# Patient Record
Sex: Female | Born: 1940 | Race: White | Hispanic: No | Marital: Married | Smoking: Former smoker
Health system: Southern US, Community
[De-identification: ages and names within clinical notes are randomized; demographics above are authoritative.]

## PROBLEM LIST (undated history)

## (undated) DIAGNOSIS — I10 Essential (primary) hypertension: Secondary | ICD-10-CM

## (undated) DIAGNOSIS — J449 Chronic obstructive pulmonary disease, unspecified: Secondary | ICD-10-CM

## (undated) DIAGNOSIS — M1990 Unspecified osteoarthritis, unspecified site: Secondary | ICD-10-CM

## (undated) DIAGNOSIS — D649 Anemia, unspecified: Secondary | ICD-10-CM

## (undated) HISTORY — PX: CHOLECYSTECTOMY: SHX55

## (undated) HISTORY — PX: EYE SURGERY: SHX253

---

## 2004-06-28 ENCOUNTER — Emergency Department: Payer: Self-pay | Admitting: Emergency Medicine

## 2006-03-11 ENCOUNTER — Emergency Department (HOSPITAL_COMMUNITY): Admission: EM | Admit: 2006-03-11 | Discharge: 2006-03-11 | Payer: Self-pay | Admitting: Emergency Medicine

## 2008-12-06 ENCOUNTER — Ambulatory Visit (HOSPITAL_COMMUNITY): Admission: RE | Admit: 2008-12-06 | Discharge: 2008-12-06 | Payer: Self-pay | Admitting: Ophthalmology

## 2008-12-20 ENCOUNTER — Ambulatory Visit (HOSPITAL_COMMUNITY): Admission: RE | Admit: 2008-12-20 | Discharge: 2008-12-20 | Payer: Self-pay | Admitting: Ophthalmology

## 2010-10-21 LAB — BASIC METABOLIC PANEL
Calcium: 10.6 mg/dL — ABNORMAL HIGH (ref 8.4–10.5)
GFR calc Af Amer: >60 mL/min (ref >60)
GFR calc non Af Amer: >60 mL/min (ref >60)
Potassium: 4.7 mEq/L (ref 3.5–5.1)
Sodium: 136 mEq/L (ref 135–145)

## 2010-10-21 LAB — HEMOGLOBIN AND HEMATOCRIT, BLOOD: HCT: 37.2 % (ref 36.0–46.0)

## 2018-11-25 ENCOUNTER — Institutional Professional Consult (permissible substitution): Payer: Self-pay | Admitting: Pulmonary Disease

## 2018-12-13 ENCOUNTER — Ambulatory Visit (INDEPENDENT_AMBULATORY_CARE_PROVIDER_SITE_OTHER): Payer: Self-pay

## 2018-12-13 ENCOUNTER — Ambulatory Visit: Payer: Medicare Other | Admitting: Internal Medicine

## 2018-12-13 ENCOUNTER — Other Ambulatory Visit: Payer: Self-pay

## 2018-12-13 ENCOUNTER — Encounter: Payer: Self-pay | Admitting: Internal Medicine

## 2018-12-13 VITALS — BP 124/62 | HR 68 | Temp 98.4°F | Ht 64.0 in | Wt 162.4 lb

## 2018-12-13 DIAGNOSIS — J449 Chronic obstructive pulmonary disease, unspecified: Secondary | ICD-10-CM | POA: Insufficient documentation

## 2018-12-13 DIAGNOSIS — I10 Essential (primary) hypertension: Secondary | ICD-10-CM

## 2018-12-13 MED ORDER — TELMISARTAN 80 MG PO TABS: 80.0000 mg | ORAL_TABLET | Freq: Every day | ORAL | 2 refills | Status: DC

## 2018-12-13 MED ORDER — BUDESONIDE-FORMOTEROL FUMARATE 80-4.5 MCG/ACT IN AERO: 2.0000 | INHALATION_SPRAY | Freq: Two times a day (BID) | RESPIRATORY_TRACT | 0 refills | Status: DC

## 2018-12-13 MED ORDER — BUDESONIDE-FORMOTEROL FUMARATE 80-4.5 MCG/ACT IN AERO: 2.0000 | INHALATION_SPRAY | Freq: Two times a day (BID) | RESPIRATORY_TRACT | 11 refills | Status: DC

## 2018-12-13 NOTE — Patient Instructions (Addendum)
Stop advair and zestril   Start Symbicort 80 Take 2 puffs first thing in am and then another 2 puffs about 12 hours later.   Only use your albuterol as a rescue medication to be used if you can't catch your breath by resting or doing a relaxed purse lip breathing pattern.  - The less you use it, the better it will work when you need it. - Ok to use up to 2 puffs  every 4 hours if you must but call for immediate appointment if use goes up over your usual need - Don't leave home without it !!  (think of it like the spare tire for your car)    Start micardis 80 mg one daily for now   Call if any problems with the medications changed   Please remember to go to the  x-ray department  for your tests - we will call you with the results when they are available   I will clear you for surgery pending review of your cxr

## 2018-12-13 NOTE — Progress Notes (Signed)
Jamie Rollins, female    DOB: [birth year],    MRN: [id number]   Brief patient profile:  77 yowf quit smoking 2015 s obvious residuals though noted slowed down a bit but walked same pace others her age until R hip problem x2019 but baseline = MMRC1 = can walk nl pace, flat grade, can't hurry or go uphills or steps s sob    Referred to pulmonary clinic 12/13/2018 by Dr Joanne Chars not scheduled yet     History of Present Illness  12/13/2018  Pulmonary/ 1st office eval/Jewett Mcgann  Chief Complaint  Patient presents with   COPD    Surgical Clearance needed. Has already quit smoking about 5 years ago.  Dyspnea:  [employer] walking better if done p hip injection, last one was done end of March 2020  Cough: none Sleep: sleep on side/ no noct symptoms  SABA use: on advair twice daily and up to bid saba but can't tell either does much    No obvious day to day or daytime variability or assoc excess/ purulent sputum or mucus plugs or hemoptysis or cp or chest tightness, subjective wheeze or overt sinus or hb symptoms.   Sleeping as above  without nocturnal  or early am exacerbation  of respiratory  c/o's or need for noct saba. Also denies any obvious fluctuation of symptoms with weather or environmental changes or other aggravating or alleviating factors except as outlined above   No unusual exposure hx or h/o childhood pna/ asthma or knowledge of premature birth.  Current Allergies, Complete Past Medical History, Past Surgical History, Family History, and Social History were reviewed in [employer] record.  ROS  The following are not active complaints unless bolded Hoarseness, sore throat, dysphagia, dental problems, itching, sneezing,  nasal congestion or discharge of excess mucus or purulent secretions, ear ache,   fever, chills, sweats, unintended wt loss or wt gain, classically pleuritic or exertional cp,  orthopnea pnd or arm/hand swelling  or leg swelling, presyncope, palpitations,  abdominal pain, anorexia, nausea, vomiting, diarrhea  or change in bowel habits or change in bladder habits, change in stools or change in urine, dysuria, hematuria,  rash, arthralgias, visual complaints, headache, numbness, weakness or ataxia or problems with walking or coordination,  change in mood or  memory.           No past medical history on file.  Outpatient Medications Prior to Visit  Medication Sig Dispense Refill   ADVAIR DISKUS 250-50 MCG/DOSE AEPB Take 1 puff by mouth 2 (two) times a day.     atorvastatin (LIPITOR) 20 MG tablet Take 1 tablet by mouth daily.     gabapentin (NEURONTIN) 100 MG capsule Take 1 capsule by mouth 2 (two) times a day.     HYDROcodone-acetaminophen (NORCO/VICODIN) 5-325 MG tablet Take 1 tablet by mouth as needed.     lisinopril (ZESTRIL) 20 MG tablet Take 1 tablet by mouth daily.     metoprolol succinate (TOPROL-XL) 25 MG 24 hr tablet Take 1 tablet by mouth daily.     omeprazole (PRILOSEC) 40 MG capsule Take 1 tablet by mouth daily.       Objective:     BP 124/62 (BP Location: Left Arm, Patient Position: Sitting, Cuff Size: Normal)    Pulse 68    Temp 98.4 F (36.9 C)    Ht  (1.626 m)    Wt 162 lb 6.4 oz (73.7 kg)    SpO2 98%    BMI  27.88 kg/m   SpO2: 98 %       HEENT: Full top denture/ nl oropharynx. Nl external ear canals without cough reflex -  Mild bilateral non-specific turbinate edema     NECK :  without JVD/Nodes/TM/ nl carotid upstrokes bilaterally   LUNGS: no acc muscle use,  Min/mild barrel  contour chest wall with bilateral  Distant bs s audible wheeze and  without cough on insp or exp maneuver and mild  Hyperresonant  to  percussion bilaterally     CV:  RRR  no s3 or murmur or increase in P2, and no edema   ABD:  soft and nontender with pos end  insp Hoover's  in the supine position. No bruits or organomegaly appreciated, bowel sounds nl  MS:   Nl gait/  ext warm without deformities, calf tenderness, cyanosis or  clubbing No obvious joint restrictions   SKIN: warm and dry without lesions    NEURO:  alert, approp, nl sensorium with  no motor or cerebellar deficits apparent.    CXR PA and Lateral:   12/13/2018 :    I personally reviewed images and agree with radiology impression as follows:    Stable exam.  No active cardiopulmonary disease        Assessment   COPD GOLD ?  Quit smoking 2015  "[facility] but nothing in their records - 12/13/2018  After extensive coaching inhaler device,  effectiveness =    75%> change advair to symb 80 2bid and ok to taper post op off  It really doesn't appear she has much copd clinically and my main concern is that chronic use of advair sometimes mimics symptoms of copd/ asthma that are very hard to sort out especially in a pt on ACEi  >>>> Therefore rec Try off advair and on symb 80 2bid and ok to taper off post op as long as not flaring or limited by doe  Stop acei for duration of surgery (see sep a/p) Cleared for surgery unless having new resp symptoms between now and surgery     Essential hypertension D/c acei 12/13/2018 (preop concern)  ACE inhibitors are problematic in  pts with airway complaints because  even experienced pulmonologists can't always distinguish ace effects from copd/asthma.  By themselves they don't actually cause a problem, much like oxygen can't by itself start a fire, but they certainly serve as a powerful catalyst or enhancer for any "fire"  or inflammatory process in the upper airway, be it caused by an ET  tube or more commonly reflux (especially in the obese or pts with known GERD or who are on biphoshonates).    In the era of ARB near equivalency until we have a better handle on the reversibility of the airway problem, it just makes sense to avoid ACEI  entirely in the short run and then decide later, having established a level of airway control using a reasonable limited regimen, whether to add back ace but even then  being very careful to observe the pt for worsening airway control and number of meds used/ needed to control symptoms.    >>>>For now changed to micardis 80 mg daily and instructed to reduce by one half if too strong.   >>>> F/u per PCP post op, but certainly call or return here for any problems related to change meds or resp status   Total time devoted to counseling  > 50 % of initial 60 min office visit:  reviewed case with pt/  device teaching which extended face to face time for this visit  discussion of options/alternatives/ personally creating written customized instructions  in presence of pt  then going over those specific  Instructions directly with the pt including how to use all of the meds but in particular covering each new medication in detail and the difference between the maintenance= "automatic" meds and the prns using an action plan format for the latter (If this problem/symptom => do that organization reading Left to right).  Please see AVS from this visit for a full list of these instructions which I personally wrote for this pt and  are unique to this visit.    Outpatient Encounter Medications as of 12/13/2018 @ discharge from clinic  Medication Sig   atorvastatin (LIPITOR) 20 MG tablet Take 1 tablet by mouth daily.   budesonide-formoterol (SYMBICORT) 80-4.5 MCG/ACT inhaler Inhale 2 puffs into the lungs 2 (two) times a day.   gabapentin (NEURONTIN) 100 MG capsule Take 1 capsule by mouth 2 (two) times a day.   HYDROcodone-acetaminophen (NORCO/VICODIN) 5-325 MG tablet Take 1 tablet by mouth as needed.   metoprolol succinate (TOPROL-XL) 25 MG 24 hr tablet Take 1 tablet by mouth daily.   omeprazole (PRILOSEC) 40 MG capsule Take 1 tablet by mouth daily.   telmisartan (MICARDIS) 80 MG tablet Take 1 tablet (80 mg total) by mouth daily.         Sandrea Hughs, MD 12/13/2018

## 2018-12-13 NOTE — Assessment & Plan Note (Signed)
Quit smoking 2015  "when dx'd at [facility]" but nothing in their records - 12/13/2018  After extensive coaching inhaler device,  effectiveness =    75%> change advair to symb 80 2bid and ok to taper post op off  It really doesn't appear she has much copd clinically and my main concern is that chronic use of advair sometimes mimics symptoms of copd/ asthma that are very hard to sort out especially in a pt on ACEi  Therefore rec Try off advair and on symb 80 2bid and ok to taper off post op as long as not flaring or limited by doe  Stop acei for duration of surgery (see sep a/p) Cleared for surgery unless having new resp symptoms between now and surgery

## 2018-12-13 NOTE — Assessment & Plan Note (Signed)
D/c acei 12/13/2018 (preop concern)  ACE inhibitors are problematic in  pts with airway complaints because  even experienced pulmonologists can't always distinguish ace effects from copd/asthma.  By themselves they don't actually cause a problem, much like oxygen can't by itself start a fire, but they certainly serve as a powerful catalyst or enhancer for any "fire"  or inflammatory process in the upper airway, be it caused by an ET  tube or more commonly reflux (especially in the obese or pts with known GERD or who are on biphoshonates).    In the era of ARB near equivalency until we have a better handle on the reversibility of the airway problem, it just makes sense to avoid ACEI  entirely in the short run and then decide later, having established a level of airway control using a reasonable limited regimen, whether to add back ace but even then being very careful to observe the pt for worsening airway control and number of meds used/ needed to control symptoms.    For now changed to micardis 80 mg daily and instructed to reduce by one half if too strong.   F/u per PCP post op, but certainly call or return here for any problems related to change meds or resp status   Total time devoted to counseling  > 50 % of initial 60 min office visit:  reviewed case with pt/  device teaching which extended face to face time for this visit  discussion of options/alternatives/ personally creating written customized instructions  in presence of pt  then going over those specific  Instructions directly with the pt including how to use all of the meds but in particular covering each new medication in detail and the difference between the maintenance= "automatic" meds and the prns using an action plan format for the latter (If this problem/symptom => do that organization reading Left to right).  Please see AVS from this visit for a full list of these instructions which I personally wrote for this pt and  are unique to this  visit.

## 2018-12-14 ENCOUNTER — Encounter: Payer: Self-pay | Admitting: Internal Medicine

## 2018-12-14 NOTE — Progress Notes (Signed)
Spoke with pt and notified of results per Dr. Wert. Pt verbalized understanding and denied any questions. 

## 2019-01-16 ENCOUNTER — Telehealth: Payer: Self-pay | Admitting: Internal Medicine

## 2019-01-16 NOTE — Telephone Encounter (Signed)
Called and spoke to patient. Relayed results that Magda Paganini gave patient on 12/14/2018.  Patient had forgotten that she was called the day after her visit with Dr. Melvyn Novas.  Patient verbalized understanding.  Patient asked what her next step was for surgery. I let her know if the surgeon's office needs anything from [place] they can fax over a surgical clearance form. Patient stated that she doesn't have a surgeon yet and didn't know how to get one.  Asked patient how she knew she needed surgical clearance if she hadn't met with a surgeon. Patient stated that her PCP sent her here.  Told patient to follow back up with PCP about who to see next. Patient stated she will call her PCP tomorrow.  Nothing further needed.

## 2019-01-19 ENCOUNTER — Encounter: Payer: Self-pay | Admitting: Orthopaedic Surgery

## 2019-01-19 ENCOUNTER — Other Ambulatory Visit: Payer: Self-pay

## 2019-01-19 ENCOUNTER — Ambulatory Visit (INDEPENDENT_AMBULATORY_CARE_PROVIDER_SITE_OTHER): Payer: Medicare Other

## 2019-01-19 ENCOUNTER — Ambulatory Visit (INDEPENDENT_AMBULATORY_CARE_PROVIDER_SITE_OTHER): Payer: Medicare Other | Admitting: Orthopaedic Surgery

## 2019-01-19 VITALS — BP 159/75 | HR 73 | Ht 64.0 in | Wt 161.0 lb

## 2019-01-19 DIAGNOSIS — G8929 Other chronic pain: Secondary | ICD-10-CM

## 2019-01-19 DIAGNOSIS — M160 Bilateral primary osteoarthritis of hip: Secondary | ICD-10-CM | POA: Diagnosis not present

## 2019-01-19 DIAGNOSIS — M25551 Pain in right hip: Secondary | ICD-10-CM

## 2019-01-22 ENCOUNTER — Encounter: Payer: Self-pay | Admitting: Orthopaedic Surgery

## 2019-01-22 DIAGNOSIS — M160 Bilateral primary osteoarthritis of hip: Secondary | ICD-10-CM | POA: Insufficient documentation

## 2019-01-22 NOTE — Progress Notes (Signed)
Office Visit Note   Patient: Jamie KitchensSharon M Rollins           Date of Birth: 02/27/1941           MRN: [id number] Visit Date: 01/19/2019              Requested by: Kirstie PeriShah, Ashish, MD [address] [place],  [place] [id number] PCP: Kirstie PeriShah, Ashish, MD   Assessment & Plan: Visit Diagnoses:  1. Chronic right hip pain   2. Bilateral primary osteoarthritis of hip     Plan: Right hip severe osteoarthritis.  She has pain that wakes her up at night she is used a cane taken anti-inflammatories topical creams.  Bothers her on a daily basis and with community ambulation.  We discussed total hip arthroplasty discussed operative technique, direct anterior approach likely 1-2 night stay in the hospital.  Risks of surgery discussed her questions were elicited and answered.  She understands and can call if she like to proceed.  Scheduling information given.  Follow-Up Instructions: No follow-ups on file.   Orders:  Orders Placed This Encounter  Procedures  . XR HIP UNILAT W OR W/O PELVIS 2-3 VIEWS RIGHT   No orders of the defined types were placed in this encounter.     Procedures: No procedures performed   Clinical Data: No additional findings.   Subjective: Chief Complaint  Patient presents with  . Right Hip - Pain    HPI 78 year old female with right hip pain that radiates from her hip down to her knee.  She is not been taking any pain medication for this.  She is past history of injections by Dr. Maurice SmallIbazebo last injetion 09/2018.  Patient states injection gave her some relief.  Hip x-rays on 03/17/2018 showed prominent degenerative changes lumbar spine and both hips.  Subchondral cyst noted in the right acetabulum.  Changes consistent with degenerative hip disease.  She is noticed Trendelenburg gait.  She has had sharp pain when she tries to cross her leg.  Pain in her hip wakes her up at night.  Patient quit smoking 5 years ago.  Pulmonary doctor is Dr. Casimiro NeedleMichael work.  She has been told she needed surgery on  her hip with hip replacement.  She has had progressive increased groin pain that radiates down to her knee progressive limping at times when she is having great difficulty ambulating despite using a cane.  Previous trochanteric injections as well as intra-articular hip injections on the right hip.  She had temporary relief.  Pulmonary note from Dr. Casimiro NeedleMichael work demonstrates  mild COPD.  Dr. Shona SimpsonWertz note from 12/13/2018 states patient was cleared for surgery from pulmonary standpoint.  Review of Systems 14 point review of systems positive for bilateral hip osteoarthritis worse on the right than left.  Positive for hypertension acid reflux cataracts teeth and gum disease.  Otherwise negative is obtains HPI.   Objective: Vital Signs: BP (!) 159/75   Pulse 73   Ht 5\' 4"  (1.626 m)   Wt 161 lb (73 kg)   BMI 27.64 kg/m   Physical Exam Constitutional:      Appearance: She is well-developed.  HENT:     Head: Normocephalic.     Right Ear: External ear normal.     Left Ear: External ear normal.  Eyes:     Pupils: Pupils are equal, round, and reactive to light.  Neck:     Thyroid: No thyromegaly.     Trachea: No tracheal deviation.  Cardiovascular:  Rate and Rhythm: Normal rate.  Pulmonary:     Effort: Pulmonary effort is normal. No respiratory distress.     Breath sounds: Stridor present. No wheezing or rhonchi.  Abdominal:     Palpations: Abdomen is soft.  Skin:    General: Skin is warm and dry.  Neurological:     Mental Status: She is alert and oriented to person, place, and time.  Psychiatric:        Behavior: Behavior normal.     Ortho Exam patient has mild discomfort with internal rotation left hip.  Right hip only has 15 degrees internal rotation with severe pain reproduction of her pain.  Positive Trendelenburg gait on the right.  Negative on the left.  Knee and ankle jerk are 2+ and symmetrical distal pulses are intact.  Negative straight leg raising 90 degrees knee shows minimal  crepitus.  Specialty Comments:  No specialty comments available.  Imaging: No results found.   PMFS History: Patient Active Problem List   Diagnosis Date Noted  . Bilateral primary osteoarthritis of hip 01/22/2019  . COPD GOLD ?  12/13/2018  . Essential hypertension 12/13/2018   No past medical history on file.  No family history on file.  No past surgical history on file. Social History   Occupational History  . Not on file  Tobacco Use  . Smoking status: Former Smoker    Types: Cigarettes    Quit date: 12/12/2013    Years since quitting: 5.1  . Smokeless tobacco: Never Used  Substance and Sexual Activity  . Alcohol use: Not on file  . Drug use: Not on file  . Sexual activity: Not on file

## 2019-02-02 ENCOUNTER — Ambulatory Visit (INDEPENDENT_AMBULATORY_CARE_PROVIDER_SITE_OTHER): Payer: Medicare Other | Admitting: Surgery

## 2019-02-02 ENCOUNTER — Other Ambulatory Visit: Payer: Self-pay

## 2019-02-02 ENCOUNTER — Encounter: Payer: Self-pay | Admitting: Surgery

## 2019-02-02 VITALS — BP 128/60 | HR 68 | Ht 64.0 in | Wt 161.0 lb

## 2019-02-02 DIAGNOSIS — M1611 Unilateral primary osteoarthritis, right hip: Secondary | ICD-10-CM

## 2019-02-02 NOTE — Progress Notes (Signed)
78 year old white female history of end-stage DJD right hip and pain comes in for preop evaluation.  Patient scheduled for right total hip replacement and she is wanting to proceed as scheduled.  Symptoms unchanged from last office visit.  Surgical procedure along with potential rehab/recovery time discussed.  All questions answered and she wishes to proceed.

## 2019-02-07 NOTE — Progress Notes (Signed)
[facility] - [place], [place] - [address] [place] [id number] Phone: [phone] Fax: [phone]    Your procedure is scheduled on Wednesday, August 5th.  [facility] to [facility] Main Entr[facility] "A" at 10:30 A.M., and check in at the Admitting office.  Call this number if you have problems the morning of surgery:  [phone]  Call [phone] if you have any questions prior to your surgery date Monday-Friday 8am-4pm   Remember:  Do not eat after midnight the night before your surgery  You may drink clear liquids until 9:30 the morning of your surgery.   Clear liquids allowed are: Water, Non-Citrus Juices (without pulp), Carbonated Beverages, Clear Tea, Black Coffee Only, and Gatorade    Take these medicines the morning of surgery with A SIP OF WATER  atorvastatin (LIPITOR) budesonide-formoterol (SYMBICORT) - inhaler gabapentin (NEURONTIN)  metoprolol succinate (TOPROL-XL) omeprazole (PRILOSEC)   If needed - albuterol (VENTOLIN HFA) -inhaler, HYDROcodone-acetaminophen ,   Follow your surgeon's instructions on when to stop Aspirin.  If no instructions were given by your surgeon then you will need to call the office to get those instructions.    7 days prior to surgery STOP taking any Aspirin (unless otherwise instructed by your surgeon), Aleve, Naproxen, Ibuprofen, Motrin, Advil, Goody's, BC's, all herbal medications, fish oil, and all vitamins.   The Morning of Surgery  Do not wear jewelry, make-up or nail polish.  Do not wear lotions, powders, or perfumes/colognes, or deodorant  Do not shave 48 hours prior to surgery.    Do not bring valuables to the hospital.  [facility] is not responsible for any belongings or valuables.  If you are a smoker, DO NOT Smoke 24 hours prior to surgery IF you wear a CPAP at night please bring your mask, tubing, and machine the morning of surgery   Remember that you must have someone to transport you home after your  surgery, and remain with you for 24 hours if you are discharged the same day.  Contacts, glasses, hearing aids, dentures or bridgework may not be worn into surgery.   Leave your suitcase in the car.  After surgery it may be brought to your room.  For patients admitted to the hospital, discharge time will be determined by your treatment team.  Patients discharged the day of surgery will not be allowed to drive home.   Special instructions:   [place]- Preparing For Surgery  Before surgery, you can play an important role. Because skin is not sterile, your skin needs to be as free of germs as possible. You can reduce the number of germs on your skin by washing with CHG (chlorahexidine gluconate) Soap before surgery.  CHG is an antiseptic cleaner which kills germs and bonds with the skin to continue killing germs even after washing.    Oral Hygiene is also important to reduce your risk of infection.  Remember - BRUSH YOUR TEETH THE MORNING OF SURGERY WITH YOUR REGULAR TOOTHPASTE  Please do not use if you have an allergy to CHG or antibacterial soaps. If your skin becomes reddened/irritated stop using the CHG.  Do not shave (including legs and underarms) for at least 48 hours prior to first CHG shower. It is OK to shave your face.  Please follow these instructions carefully.   1. Shower the NIGHT BEFORE SURGERY and the MORNING OF SURGERY with CHG Soap.   2. If you chose to wash your hair, wash your hair  first as usual with your normal shampoo.  3. After you shampoo, rinse your hair and body thoroughly to remove the shampoo.  4. Use CHG as you would any other liquid soap. You can apply CHG directly to the skin and wash gently with a scrungie or a clean washcloth.   5. Apply the CHG Soap to your body ONLY FROM THE NECK DOWN.  Do not use on open wounds or open sores. Avoid contact with your eyes, ears, mouth and genitals (private parts). Wash Face and genitals (private parts)  with your  normal soap.   6. Wash thoroughly, paying special attention to the area where your surgery will be performed.  7. Thoroughly rinse your body with warm water from the neck down.  8. DO NOT shower/wash with your normal soap after using and rinsing off the CHG Soap.  9. Pat yourself dry with a CLEAN TOWEL.  10. Wear CLEAN PAJAMAS to bed the night before surgery, wear comfortable clothes the morning of surgery  11. Place CLEAN SHEETS on your bed the night of your first shower and DO NOT SLEEP WITH PETS.  Day of Surgery: Do not apply any deodorants/lotions. Please shower the morning of surgery with the CHG soap  Please wear clean clothes to the hospital/surgery center.   Remember to brush your teeth WITH YOUR REGULAR TOOTHPASTE.  Please read over the following fact sheets that you were given.

## 2019-02-08 ENCOUNTER — Encounter (HOSPITAL_COMMUNITY): Payer: Self-pay

## 2019-02-08 ENCOUNTER — Encounter (HOSPITAL_COMMUNITY)
Admission: RE | Admit: 2019-02-08 | Discharge: 2019-02-08 | Disposition: A | Discharge status: Home / Self Care | Payer: Medicare Other | Source: Ambulatory Visit | Attending: Orthopaedic Surgery | Admitting: Orthopaedic Surgery

## 2019-02-08 ENCOUNTER — Other Ambulatory Visit: Payer: Self-pay

## 2019-02-08 DIAGNOSIS — Z20828 Contact with and (suspected) exposure to other viral communicable diseases: Secondary | ICD-10-CM | POA: Diagnosis not present

## 2019-02-08 DIAGNOSIS — Z01818 Encounter for other preprocedural examination: Secondary | ICD-10-CM | POA: Insufficient documentation

## 2019-02-08 HISTORY — DX: Chronic obstructive pulmonary disease, unspecified: J44.9

## 2019-02-08 HISTORY — DX: Essential (primary) hypertension: I10

## 2019-02-08 HISTORY — DX: Anemia, unspecified: D64.9

## 2019-02-08 HISTORY — DX: Unspecified osteoarthritis, unspecified site: M19.90

## 2019-02-08 LAB — URINALYSIS, ROUTINE W REFLEX MICROSCOPIC
Bilirubin Urine: NEGATIVE
Glucose, UA: NEGATIVE mg/dL
Hgb urine dipstick: NEGATIVE
Ketones, ur: NEGATIVE mg/dL
Leukocytes,Ua: NEGATIVE
Nitrite: NEGATIVE
Protein, ur: NEGATIVE mg/dL
Specific Gravity, Urine: 1.008 (ref 1.005–1.030)
pH: 6 (ref 5.0–8.0)

## 2019-02-08 LAB — COMPREHENSIVE METABOLIC PANEL
ALT: 13 U/L (ref 0–44)
AST: 25 U/L (ref 15–41)
Albumin: 3.7 g/dL (ref 3.5–5.0)
Alkaline Phosphatase: 93 U/L (ref 38–126)
Anion gap: 8 (ref 5–15)
BUN: 20 mg/dL (ref 8–23)
CO2: 23 mmol/L (ref 22–32)
Calcium: 10.9 mg/dL — ABNORMAL HIGH (ref 8.9–10.3)
Chloride: 107 mmol/L (ref 98–111)
Creatinine, Ser: 1.29 mg/dL — ABNORMAL HIGH (ref 0.44–1.00)
GFR calc Af Amer: 47 mL/min — ABNORMAL LOW (ref >60)
GFR calc non Af Amer: 40 mL/min — ABNORMAL LOW (ref >60)
Glucose, Bld: 101 mg/dL — ABNORMAL HIGH (ref 70–99)
Potassium: 5.2 mmol/L — ABNORMAL HIGH (ref 3.5–5.1)
Sodium: 138 mmol/L (ref 135–145)
Total Bilirubin: 0.8 mg/dL (ref 0.3–1.2)
Total Protein: 6.5 g/dL (ref 6.5–8.1)

## 2019-02-08 LAB — CBC
HCT: 32.8 % — ABNORMAL LOW (ref 36.0–46.0)
Hemoglobin: 10.8 g/dL — ABNORMAL LOW (ref 12.0–15.0)
MCH: 33.6 pg (ref 26.0–34.0)
MCHC: 32.9 g/dL (ref 30.0–36.0)
MCV: 102.2 fL — ABNORMAL HIGH (ref 80.0–100.0)
Platelets: 483 10*3/uL — ABNORMAL HIGH (ref 150–400)
RBC: 3.21 MIL/uL — ABNORMAL LOW (ref 3.87–5.11)
RDW: 13.5 % (ref 11.5–15.5)
WBC: 6.4 10*3/uL (ref 4.0–10.5)
nRBC: 0 % (ref 0.0–0.2)

## 2019-02-08 LAB — SURGICAL PCR SCREEN
MRSA, PCR: NEGATIVE
Staphylococcus aureus: NEGATIVE

## 2019-02-08 NOTE — Progress Notes (Addendum)
PCP - Dr Monico Blitz Cardiologist - denies  Chest x-ray - 12/13/2018 EKG - 02/08/2019 Stress Test - denies ECHO - denies Cardiac Cath - denies  Sleep Study - denies CPAP - N/A  Blood Thinner Instructions: N/A Aspirin Instructions: per patient "no instructions received" patient instructed to contact surgeon's office for further instructions  Anesthesia review: YES, abnormal labs  Coronavirus Screening  Have you experienced the following symptoms:  Cough yes/no: No Fever (>100.51F)  yes/no: No Runny nose yes/no: No Sore throat yes/no: No Difficulty breathing/shortness of breath  yes/no: No  Have you or a family member traveled in the last 14 days and where? yes/no: No  If the patient indicates "YES" to the above questions, their PAT will be rescheduled to limit the exposure to others and, the surgeon will be notified. THE PATIENT WILL NEED TO BE ASYMPTOMATIC FOR 14 DAYS.   If the patient is not experiencing any of these symptoms, the PAT nurse will instruct them to NOT bring anyone with them to their appointment since they may have these symptoms or traveled as well.   Please remind your patients and families that hospital visitation restrictions are in effect and the importance of the restrictions.   Patient denies shortness of breath, fever, cough and chest pain at PAT appointment  Patient verbalized understanding of instructions that were given to them at the PAT appointment. Patient was also instructed that they will need to review over the PAT instructions again at home before surgery.

## 2019-02-09 NOTE — Anesthesia Preprocedure Evaluation (Addendum)
Anesthesia Evaluation  Patient identified by MRN, date of birth, ID band Patient awake    Reviewed: Allergy & Precautions, NPO status , Patient's Chart, lab work & pertinent test results  History of Anesthesia Complications Negative for: history of anesthetic complications  Airway Mallampati: II  TM Distance: >3 FB Neck ROM: Full    Dental  (+) Dental Advisory Given, Upper Dentures   Pulmonary COPD,  COPD inhaler, former smoker,    Pulmonary exam normal        Cardiovascular hypertension, Pt. on home beta blockers and Pt. on medications (-) anginaNormal cardiovascular exam     Neuro/Psych negative neurological ROS  negative psych ROS   GI/Hepatic Neg liver ROS, GERD  Medicated and Controlled,  Endo/Other  negative endocrine ROS  Renal/GU Renal InsufficiencyRenal disease     Musculoskeletal  (+) Arthritis ,   Abdominal   Peds  Hematology  (+) anemia ,   Anesthesia Other Findings   Reproductive/Obstetrics                           Anesthesia Physical Anesthesia Plan  ASA: III  Anesthesia Plan: Spinal   Post-op Pain Management:    Induction:   PONV Risk Score and Plan: 2 and Treatment may vary due to age or medical condition and Propofol infusion  Airway Management Planned: Natural Airway and Simple Face Mask  Additional Equipment: None  Intra-op Plan:   Post-operative Plan:   Informed Consent: I have reviewed the patients History and Physical, chart, labs and discussed the procedure including the risks, benefits and alternatives for the proposed anesthesia with the patient or authorized representative who has indicated his/her understanding and acceptance.       Plan Discussed with: CRNA and Anesthesiologist  Anesthesia Plan Comments: (Labs reviewed, platelets acceptable. Discussed risks and benefits of spinal, including spinal/epidural hematoma, infection, failed block,  and PDPH. Patient expressed understanding and wished to proceed. )      Anesthesia Quick Evaluation

## 2019-02-10 ENCOUNTER — Other Ambulatory Visit: Payer: Self-pay

## 2019-02-11 ENCOUNTER — Other Ambulatory Visit (HOSPITAL_COMMUNITY)
Admission: RE | Admit: 2019-02-11 | Discharge: 2019-02-11 | Disposition: A | Discharge status: Home / Self Care | Payer: Medicare Other | Source: Ambulatory Visit | Attending: Orthopaedic Surgery | Admitting: Orthopaedic Surgery

## 2019-02-11 DIAGNOSIS — Z01818 Encounter for other preprocedural examination: Secondary | ICD-10-CM | POA: Diagnosis not present

## 2019-02-11 LAB — SARS CORONAVIRUS 2 (TAT 6-24 HRS): SARS Coronavirus 2: NEGATIVE

## 2019-02-13 ENCOUNTER — Telehealth: Payer: Self-pay | Admitting: Orthopaedic Surgery

## 2019-02-13 NOTE — Telephone Encounter (Signed)
Patient is scheduled for right total hip arthroplasty 02-15-19 with Dr. Lorin Mercy.  PCP had cleared patient 01-31-19.  At the request of Benjiman Core, [place]  the results from patient's pre op labs done on 02-08-19 were faxed to [facility] today. I spoke Otila Kluver at PCP office this afternoon since we are waiting on a reply. She transferred me to [place] and message was left to advise once she had viewed the lab results.

## 2019-02-14 ENCOUNTER — Telehealth: Payer: Self-pay | Admitting: Orthopaedic Surgery

## 2019-02-14 NOTE — Telephone Encounter (Signed)
I called [facility], spoke with Otila Kluver and was transferred to Dr. Arsenio Katz. Dr. Cyndi Bender stated that she had reviewed patients labs that were drawn since her signed clearance on 01-31-19 and patient is ok to move forward with surgery.    [phone] at 3:10pm

## 2019-02-14 NOTE — H&P (Addendum)
TOTAL HIP ADMISSION H&P  Patient is admitted for right total hip arthroplasty.  Subjective:  Chief Complaint: right hip pain  HPI: Jamie Rollins, 78 y.o. female, has a history of pain and functional disability in the right hip(s) due to arthritis and patient has failed non-surgical conservative treatments for greater than 12 weeks to include NSAID's and/or analgesics, use of assistive devices and activity modification.  Onset of symptoms was gradual starting 10 years ago with gradually worsening course since that time  Patient currently rates pain in the right hip at 10 out of 10 with activity. Patient has night pain, worsening of pain with activity and weight bearing, trendelenberg gait, pain that interfers with activities of daily living and pain with passive range of motion. Patient has evidence of subchondral sclerosis, periarticular osteophytes and joint space narrowing by imaging studies. This condition presents safety issues increasing the risk of falls. .  There is no current active infection.  Patient Active Problem List   Diagnosis Date Noted  . Bilateral primary osteoarthritis of hip 01/22/2019  . COPD GOLD ?  12/13/2018  . Essential hypertension 12/13/2018   Past Medical History:  Diagnosis Date  . Anemia   . Arthritis   . COPD (chronic obstructive pulmonary disease) (HCC)   . Hypertension     Past Surgical History:  Procedure Laterality Date  . CHOLECYSTECTOMY      No current facility-administered medications for this encounter.    Current Outpatient Medications  Medication Sig Dispense Refill Last Dose  . albuterol (VENTOLIN HFA) 108 (90 Base) MCG/ACT inhaler Inhale 2 puffs into the lungs every 6 (six) hours as needed for wheezing or shortness of breath.     Marland Kitchen. aspirin EC 81 MG tablet Take 81 mg by mouth daily.     Marland Kitchen. atorvastatin (LIPITOR) 20 MG tablet Take 20 mg by mouth daily.      . budesonide-formoterol (SYMBICORT) 80-4.5 MCG/ACT inhaler Inhale 2 puffs into the  lungs 2 (two) times a day. 1 Inhaler 11   . gabapentin (NEURONTIN) 100 MG capsule Take 100 mg by mouth daily.      Marland Kitchen. HYDROcodone-acetaminophen (NORCO/VICODIN) 5-325 MG tablet Take 1 tablet by mouth every 6 (six) hours as needed for severe pain.      . metoprolol succinate (TOPROL-XL) 25 MG 24 hr tablet Take 25 mg by mouth daily.      . MULTIPLE VITAMIN PO Take 1 tablet by mouth daily.      . Omega-3 Fatty Acids (FISH OIL) 1000 MG CAPS Take 2,000 mg by mouth daily.      Marland Kitchen. omeprazole (PRILOSEC) 40 MG capsule Take 40 mg by mouth daily.      Marland Kitchen. telmisartan (MICARDIS) 80 MG tablet Take 1 tablet (80 mg total) by mouth daily. 30 tablet 2    Allergies  Allergen Reactions  . Codeine Nausea And Vomiting    Social History   Tobacco Use  . Smoking status: Former Smoker    Types: Cigarettes    Quit date: 12/12/2013    Years since quitting: 5.1  . Smokeless tobacco: Never Used  Substance Use Topics  . Alcohol use: Never    Frequency: Never    No family history on file.   Review of Systems  Constitutional: Negative.   HENT: Negative.   Respiratory: Negative.   Cardiovascular: Negative.   Genitourinary: Negative.   Musculoskeletal: Positive for joint pain.  Skin: Negative.   Neurological: Negative.   Psychiatric/Behavioral: Negative.  Objective:  Physical Exam  Constitutional: She is oriented to person, place, and time. No distress.  HENT:  Head: Normocephalic and atraumatic.  Eyes: Pupils are equal, round, and reactive to light. EOM are normal.  Neck: Normal range of motion.  Cardiovascular: Normal rate.  Respiratory: Effort normal. No respiratory distress.  GI: Soft. She exhibits no distension.  Musculoskeletal:     Comments: Gait antalgic.  Right hip pain with internal/external rotation.  Neurological: She is alert and oriented to person, place, and time.  Skin: Skin is warm and dry.  Psychiatric: She has a normal mood and affect.    Vital signs in last 24 hours:     Labs:   Estimated body mass index is 27.51 kg/m as calculated from the following:   Height as of 02/08/19: 5' 4.75" (1.645 m).   Weight as of 02/08/19: 74.4 kg.   Imaging Review Plain radiographs demonstrate moderate degenerative joint disease of the right hip(s). The bone quality appears to be good for age and reported activity level.      Assessment/Plan:  End stage arthritis, right hip(s)  The patient history, physical examination, clinical judgement of the provider and imaging studies are consistent with end stage degenerative joint disease of the right hip(s) and total hip arthroplasty is deemed medically necessary. The treatment options including medical management, injection therapy, arthroscopy and arthroplasty were discussed at length. The risks and benefits of total hip arthroplasty were presented and reviewed. The risks due to aseptic loosening, infection, stiffness, dislocation/subluxation,  thromboembolic complications and other imponderables were discussed.  The patient acknowledged the explanation, agreed to proceed with the plan and consent was signed. Patient is being admitted for inpatient treatment for surgery, pain control, PT, OT, prophylactic antibiotics, VTE prophylaxis, progressive ambulation and ADL's and discharge planning.The patient is planning to be discharged home with home health services

## 2019-02-14 NOTE — Telephone Encounter (Signed)
fyi

## 2019-02-14 NOTE — Telephone Encounter (Signed)
Ok proceed with surgery thanks

## 2019-02-15 ENCOUNTER — Ambulatory Visit (HOSPITAL_COMMUNITY): Payer: Medicare Other

## 2019-02-15 ENCOUNTER — Ambulatory Visit (HOSPITAL_COMMUNITY): Payer: Medicare Other | Admitting: Certified Registered Nurse Anesthetist

## 2019-02-15 ENCOUNTER — Other Ambulatory Visit: Payer: Self-pay

## 2019-02-15 ENCOUNTER — Inpatient Hospital Stay (HOSPITAL_COMMUNITY): Payer: Medicare Other

## 2019-02-15 ENCOUNTER — Inpatient Hospital Stay (HOSPITAL_COMMUNITY)
Admission: AD | Admit: 2019-02-15 | Discharge: 2019-02-18 | DRG: 470 | Disposition: A | Discharge status: Home Health | Payer: Medicare Other | Attending: Orthopaedic Surgery | Admitting: Orthopaedic Surgery

## 2019-02-15 ENCOUNTER — Ambulatory Visit (HOSPITAL_COMMUNITY): Payer: Medicare Other | Admitting: Vascular Surgery

## 2019-02-15 ENCOUNTER — Encounter (HOSPITAL_COMMUNITY)
Admission: AD | Disposition: A | Discharge status: Home Health | Payer: Self-pay | Source: Home / Self Care | Attending: Orthopaedic Surgery

## 2019-02-15 ENCOUNTER — Encounter (HOSPITAL_COMMUNITY): Payer: Self-pay

## 2019-02-15 DIAGNOSIS — Z87891 Personal history of nicotine dependence: Secondary | ICD-10-CM | POA: Diagnosis not present

## 2019-02-15 DIAGNOSIS — K219 Gastro-esophageal reflux disease without esophagitis: Secondary | ICD-10-CM | POA: Diagnosis present

## 2019-02-15 DIAGNOSIS — Z419 Encounter for procedure for purposes other than remedying health state, unspecified: Secondary | ICD-10-CM

## 2019-02-15 DIAGNOSIS — Z9181 History of falling: Secondary | ICD-10-CM | POA: Diagnosis not present

## 2019-02-15 DIAGNOSIS — Z7982 Long term (current) use of aspirin: Secondary | ICD-10-CM | POA: Diagnosis not present

## 2019-02-15 DIAGNOSIS — I10 Essential (primary) hypertension: Secondary | ICD-10-CM | POA: Diagnosis present

## 2019-02-15 DIAGNOSIS — M1611 Unilateral primary osteoarthritis, right hip: Principal | ICD-10-CM | POA: Diagnosis present

## 2019-02-15 DIAGNOSIS — Z7951 Long term (current) use of inhaled steroids: Secondary | ICD-10-CM | POA: Diagnosis not present

## 2019-02-15 DIAGNOSIS — Z01818 Encounter for other preprocedural examination: Secondary | ICD-10-CM

## 2019-02-15 DIAGNOSIS — J449 Chronic obstructive pulmonary disease, unspecified: Secondary | ICD-10-CM | POA: Diagnosis present

## 2019-02-15 DIAGNOSIS — D62 Acute posthemorrhagic anemia: Secondary | ICD-10-CM | POA: Diagnosis not present

## 2019-02-15 DIAGNOSIS — Z09 Encounter for follow-up examination after completed treatment for conditions other than malignant neoplasm: Secondary | ICD-10-CM

## 2019-02-15 HISTORY — PX: TOTAL HIP ARTHROPLASTY: SHX124

## 2019-02-15 LAB — CBC
HCT: 24.3 % — ABNORMAL LOW (ref 36.0–46.0)
Hemoglobin: 7.3 g/dL — ABNORMAL LOW (ref 12.0–15.0)
MCH: 31.5 pg (ref 26.0–34.0)
MCHC: 30 g/dL (ref 30.0–36.0)
MCV: 104.7 fL — ABNORMAL HIGH (ref 80.0–100.0)
Platelets: 189 10*3/uL (ref 150–400)
RBC: 2.32 MIL/uL — ABNORMAL LOW (ref 3.87–5.11)
RDW: 13.4 % (ref 11.5–15.5)
WBC: 11.3 10*3/uL — ABNORMAL HIGH (ref 4.0–10.5)
nRBC: 0 % (ref 0.0–0.2)

## 2019-02-15 LAB — HEMOGLOBIN AND HEMATOCRIT, BLOOD
HCT: 24.4 % — ABNORMAL LOW (ref 36.0–46.0)
Hemoglobin: 8 g/dL — ABNORMAL LOW (ref 12.0–15.0)

## 2019-02-15 LAB — BASIC METABOLIC PANEL
Anion gap: 6 (ref 5–15)
BUN: 15 mg/dL (ref 8–23)
CO2: 23 mmol/L (ref 22–32)
Calcium: 9.3 mg/dL (ref 8.9–10.3)
Chloride: 106 mmol/L (ref 98–111)
Creatinine, Ser: 1.14 mg/dL — ABNORMAL HIGH (ref 0.44–1.00)
GFR calc Af Amer: 54 mL/min — ABNORMAL LOW (ref >60)
GFR calc non Af Amer: 47 mL/min — ABNORMAL LOW (ref >60)
Glucose, Bld: 132 mg/dL — ABNORMAL HIGH (ref 70–99)
Potassium: 4.7 mmol/L (ref 3.5–5.1)
Sodium: 135 mmol/L (ref 135–145)

## 2019-02-15 LAB — PREPARE RBC (CROSSMATCH)

## 2019-02-15 LAB — ABO/RH: ABO/RH(D): O POS

## 2019-02-15 SURGERY — ARTHROPLASTY, HIP, TOTAL, ANTERIOR APPROACH
Anesthesia: Spinal | Site: Hip | Laterality: Right

## 2019-02-15 MED ORDER — ACETAMINOPHEN 325 MG PO TABS
325.0000 mg | ORAL_TABLET | Freq: Four times a day (QID) | ORAL | Status: DC | PRN
  Administered 2019-02-16 – 2019-02-18 (×4): 650 mg via ORAL
  Filled 2019-02-15 (×4): qty 2

## 2019-02-15 MED ORDER — OXYCODONE HCL 5 MG PO TABS
5.0000 mg | ORAL_TABLET | Freq: Four times a day (QID) | ORAL | Status: DC | PRN
  Administered 2019-02-15 – 2019-02-18 (×7): 5 mg via ORAL
  Filled 2019-02-15 (×7): qty 1

## 2019-02-15 MED ORDER — DOCUSATE SODIUM 100 MG PO CAPS
100.0000 mg | ORAL_CAPSULE | Freq: Two times a day (BID) | ORAL | Status: DC
  Administered 2019-02-15 – 2019-02-18 (×6): 100 mg via ORAL
  Filled 2019-02-15 (×6): qty 1

## 2019-02-15 MED ORDER — FENTANYL CITRATE (PF) 100 MCG/2ML IJ SOLN
INTRAMUSCULAR | Status: AC
  Filled 2019-02-15: qty 2

## 2019-02-15 MED ORDER — BUPIVACAINE IN DEXTROSE 0.75-8.25 % IT SOLN
INTRATHECAL | Status: DC | PRN
  Administered 2019-02-15: 1.8 mL via INTRATHECAL

## 2019-02-15 MED ORDER — METOCLOPRAMIDE HCL 5 MG PO TABS: 5.0000 mg | ORAL_TABLET | Freq: Three times a day (TID) | ORAL | Status: DC | PRN

## 2019-02-15 MED ORDER — METOPROLOL SUCCINATE ER 25 MG PO TB24
25.0000 mg | ORAL_TABLET | Freq: Every day | ORAL | Status: DC
  Administered 2019-02-16 – 2019-02-18 (×3): 25 mg via ORAL
  Filled 2019-02-15 (×3): qty 1

## 2019-02-15 MED ORDER — PROPOFOL 10 MG/ML IV BOLUS
INTRAVENOUS | Status: DC | PRN
  Administered 2019-02-15 (×2): 30 mg via INTRAVENOUS

## 2019-02-15 MED ORDER — ALBUTEROL SULFATE (2.5 MG/3ML) 0.083% IN NEBU: 2.5000 mg | INHALATION_SOLUTION | Freq: Four times a day (QID) | RESPIRATORY_TRACT | Status: DC | PRN

## 2019-02-15 MED ORDER — ONDANSETRON HCL 4 MG/2ML IJ SOLN
4.0000 mg | Freq: Once | INTRAMUSCULAR | Status: AC | PRN
  Administered 2019-02-15: 4 mg via INTRAVENOUS

## 2019-02-15 MED ORDER — LACTATED RINGERS IV SOLN
INTRAVENOUS | Status: DC
  Administered 2019-02-15 (×2): via INTRAVENOUS

## 2019-02-15 MED ORDER — SODIUM CHLORIDE 0.9% IV SOLUTION: Freq: Once | INTRAVENOUS | Status: DC

## 2019-02-15 MED ORDER — PHENYLEPHRINE HCL-NACL 10-0.9 MG/250ML-% IV SOLN: 0.0000 ug/min | INTRAVENOUS | Status: DC

## 2019-02-15 MED ORDER — PROPOFOL 500 MG/50ML IV EMUL
INTRAVENOUS | Status: DC | PRN
  Administered 2019-02-15: 50 ug/kg/min via INTRAVENOUS

## 2019-02-15 MED ORDER — METOCLOPRAMIDE HCL 5 MG/ML IJ SOLN
5.0000 mg | Freq: Three times a day (TID) | INTRAMUSCULAR | Status: DC | PRN
  Administered 2019-02-16: 5 mg via INTRAVENOUS
  Filled 2019-02-15: qty 2

## 2019-02-15 MED ORDER — PHENOL 1.4 % MT LIQD: 1.0000 | OROMUCOSAL | Status: DC | PRN

## 2019-02-15 MED ORDER — GABAPENTIN 100 MG PO CAPS
100.0000 mg | ORAL_CAPSULE | Freq: Every day | ORAL | Status: DC
  Administered 2019-02-16 – 2019-02-18 (×3): 100 mg via ORAL
  Filled 2019-02-15 (×3): qty 1

## 2019-02-15 MED ORDER — MENTHOL 3 MG MT LOZG: 1.0000 | LOZENGE | OROMUCOSAL | Status: DC | PRN

## 2019-02-15 MED ORDER — ATORVASTATIN CALCIUM 10 MG PO TABS
20.0000 mg | ORAL_TABLET | Freq: Every day | ORAL | Status: DC
  Administered 2019-02-16 – 2019-02-18 (×3): 20 mg via ORAL
  Filled 2019-02-15 (×3): qty 2

## 2019-02-15 MED ORDER — ONDANSETRON HCL 4 MG/2ML IJ SOLN: 4.0000 mg | Freq: Four times a day (QID) | INTRAMUSCULAR | Status: DC | PRN

## 2019-02-15 MED ORDER — POLYETHYLENE GLYCOL 3350 17 G PO PACK: 17.0000 g | PACK | Freq: Every day | ORAL | Status: DC | PRN

## 2019-02-15 MED ORDER — PANTOPRAZOLE SODIUM 40 MG PO TBEC
40.0000 mg | DELAYED_RELEASE_TABLET | Freq: Every day | ORAL | Status: DC
  Administered 2019-02-16 – 2019-02-18 (×3): 40 mg via ORAL
  Filled 2019-02-15 (×3): qty 1

## 2019-02-15 MED ORDER — METOCLOPRAMIDE HCL 5 MG/ML IJ SOLN: 5.0000 mg | Freq: Three times a day (TID) | INTRAMUSCULAR | Status: DC | PRN

## 2019-02-15 MED ORDER — CEFAZOLIN SODIUM-DEXTROSE 2-4 GM/100ML-% IV SOLN
INTRAVENOUS | Status: AC
  Filled 2019-02-15: qty 100

## 2019-02-15 MED ORDER — CEFAZOLIN SODIUM-DEXTROSE 2-4 GM/100ML-% IV SOLN
2.0000 g | INTRAVENOUS | Status: AC
  Administered 2019-02-15: 13:00:00 2 g via INTRAVENOUS

## 2019-02-15 MED ORDER — FENTANYL CITRATE (PF) 100 MCG/2ML IJ SOLN
25.0000 ug | INTRAMUSCULAR | Status: DC | PRN
  Administered 2019-02-15 (×2): 25 ug via INTRAVENOUS
  Administered 2019-02-15: 50 ug via INTRAVENOUS
  Administered 2019-02-15 (×2): 25 ug via INTRAVENOUS

## 2019-02-15 MED ORDER — SODIUM CHLORIDE 0.9 % IV SOLN
INTRAVENOUS | Status: DC
  Administered 2019-02-15: 20:00:00 via INTRAVENOUS

## 2019-02-15 MED ORDER — ONDANSETRON HCL 4 MG/2ML IJ SOLN
INTRAMUSCULAR | Status: AC
  Filled 2019-02-15: qty 2

## 2019-02-15 MED ORDER — CEFAZOLIN SODIUM-DEXTROSE 1-4 GM/50ML-% IV SOLN
1.0000 g | Freq: Three times a day (TID) | INTRAVENOUS | Status: AC
  Administered 2019-02-15 – 2019-02-16 (×2): 1 g via INTRAVENOUS
  Filled 2019-02-15 (×2): qty 50

## 2019-02-15 MED ORDER — IRBESARTAN 300 MG PO TABS
300.0000 mg | ORAL_TABLET | Freq: Every day | ORAL | Status: DC
  Administered 2019-02-15 – 2019-02-18 (×4): 300 mg via ORAL
  Filled 2019-02-15 (×4): qty 1

## 2019-02-15 MED ORDER — FENTANYL CITRATE (PF) 100 MCG/2ML IJ SOLN
INTRAMUSCULAR | Status: DC | PRN
  Administered 2019-02-15 (×2): 50 ug via INTRAVENOUS

## 2019-02-15 MED ORDER — ALBUMIN HUMAN 5 % IV SOLN
INTRAVENOUS | Status: DC | PRN
  Administered 2019-02-15 (×2): via INTRAVENOUS

## 2019-02-15 MED ORDER — HYDROMORPHONE HCL 1 MG/ML IJ SOLN
0.5000 mg | INTRAMUSCULAR | Status: DC | PRN
  Administered 2019-02-15 – 2019-02-17 (×4): 0.5 mg via INTRAVENOUS
  Filled 2019-02-15 (×4): qty 1

## 2019-02-15 MED ORDER — PHENYLEPHRINE 40 MCG/ML (10ML) SYRINGE FOR IV PUSH (FOR BLOOD PRESSURE SUPPORT)
PREFILLED_SYRINGE | INTRAVENOUS | Status: DC | PRN
  Administered 2019-02-15 (×3): 80 ug via INTRAVENOUS

## 2019-02-15 MED ORDER — MOMETASONE FURO-FORMOTEROL FUM 100-5 MCG/ACT IN AERO
2.0000 | INHALATION_SPRAY | Freq: Two times a day (BID) | RESPIRATORY_TRACT | Status: DC
  Administered 2019-02-15 – 2019-02-18 (×6): 2 via RESPIRATORY_TRACT
  Filled 2019-02-15: qty 8.8

## 2019-02-15 MED ORDER — 0.9 % SODIUM CHLORIDE (POUR BTL) OPTIME
TOPICAL | Status: DC | PRN
  Administered 2019-02-15: 1000 mL

## 2019-02-15 MED ORDER — BUPIVACAINE-EPINEPHRINE 0.25% -1:200000 IJ SOLN
INTRAMUSCULAR | Status: DC | PRN
  Administered 2019-02-15: 10 mL

## 2019-02-15 MED ORDER — SODIUM CHLORIDE 0.9 % IV SOLN
INTRAVENOUS | Status: DC | PRN
  Administered 2019-02-15: 25 ug/min via INTRAVENOUS

## 2019-02-15 MED ORDER — PROPOFOL 1000 MG/100ML IV EMUL
INTRAVENOUS | Status: AC
  Filled 2019-02-15: qty 100

## 2019-02-15 MED ORDER — TRANEXAMIC ACID 1000 MG/10ML IV SOLN
2000.0000 mg | Freq: Once | INTRAVENOUS | Status: DC
  Filled 2019-02-15: qty 20

## 2019-02-15 MED ORDER — ONDANSETRON HCL 4 MG/2ML IJ SOLN
INTRAMUSCULAR | Status: DC | PRN
  Administered 2019-02-15: 4 mg via INTRAVENOUS

## 2019-02-15 MED ORDER — CHLORHEXIDINE GLUCONATE 4 % EX LIQD: 60.0000 mL | Freq: Once | CUTANEOUS | Status: DC

## 2019-02-15 MED ORDER — ALBUMIN HUMAN 5 % IV SOLN: 12.5000 g | Freq: Once | INTRAVENOUS | Status: DC

## 2019-02-15 MED ORDER — ONDANSETRON HCL 4 MG PO TABS: 4.0000 mg | ORAL_TABLET | Freq: Four times a day (QID) | ORAL | Status: DC | PRN

## 2019-02-15 MED ORDER — ALBUTEROL SULFATE HFA 108 (90 BASE) MCG/ACT IN AERS: 2.0000 | INHALATION_SPRAY | Freq: Four times a day (QID) | RESPIRATORY_TRACT | Status: DC | PRN

## 2019-02-15 MED ORDER — PHENYLEPHRINE HCL-NACL 10-0.9 MG/250ML-% IV SOLN
0.0000 ug/min | INTRAVENOUS | Status: DC
  Filled 2019-02-15: qty 250

## 2019-02-15 MED ORDER — TRANEXAMIC ACID 1000 MG/10ML IV SOLN
INTRAVENOUS | Status: DC | PRN
  Administered 2019-02-15: 2000 mg via TOPICAL

## 2019-02-15 MED ORDER — BUPIVACAINE-EPINEPHRINE (PF) 0.25% -1:200000 IJ SOLN
INTRAMUSCULAR | Status: AC
  Filled 2019-02-15: qty 30

## 2019-02-15 MED ORDER — ASPIRIN EC 325 MG PO TBEC
325.0000 mg | DELAYED_RELEASE_TABLET | Freq: Every day | ORAL | Status: DC
  Administered 2019-02-17 – 2019-02-18 (×2): 325 mg via ORAL
  Filled 2019-02-15 (×2): qty 1

## 2019-02-15 SURGICAL SUPPLY — 63 items
APL SKNCLS STERI-STRIP NONHPOA (GAUZE/BANDAGES/DRESSINGS) ×1
BENZOIN TINCTURE PRP APPL 2/3 (GAUZE/BANDAGES/DRESSINGS) ×3 IMPLANT
BLADE CLIPPER SURG (BLADE) IMPLANT
BLADE SAW SGTL 18X1.27X75 (BLADE) ×2 IMPLANT
BLADE SAW SGTL 18X1.27X75MM (BLADE) ×1
CELLS DAT CNTRL 66122 CELL SVR (MISCELLANEOUS) ×1 IMPLANT
CLOSURE WOUND 1/2 X4 (GAUZE/BANDAGES/DRESSINGS) ×1
COVER SURGICAL LIGHT HANDLE (MISCELLANEOUS) ×3 IMPLANT
COVER WAND RF STERILE (DRAPES) ×1 IMPLANT
CUP ACETABULAR GRIPTON 100 52 (Orthopedic Implant) IMPLANT
DRAPE C-ARM 42X72 X-RAY (DRAPES) ×3 IMPLANT
DRAPE IMP U-DRAPE 54X76 (DRAPES) ×3 IMPLANT
DRAPE STERI IOBAN 125X83 (DRAPES) ×3 IMPLANT
DRAPE U-SHAPE 47X51 STRL (DRAPES) ×9 IMPLANT
DRSG MEPILEX BORDER 4X8 (GAUZE/BANDAGES/DRESSINGS) ×3 IMPLANT
DRSG PAD ABDOMINAL 8X10 ST (GAUZE/BANDAGES/DRESSINGS) ×4 IMPLANT
DURAPREP 26ML APPLICATOR (WOUND CARE) ×3 IMPLANT
ELECT BLADE 4.0 EZ CLEAN MEGAD (MISCELLANEOUS)
ELECT CAUTERY BLADE 6.4 (BLADE) ×3 IMPLANT
ELECT REM PT RETURN 9FT ADLT (ELECTROSURGICAL) ×3
ELECTRODE BLDE 4.0 EZ CLN MEGD (MISCELLANEOUS) IMPLANT
ELECTRODE REM PT RTRN 9FT ADLT (ELECTROSURGICAL) ×1 IMPLANT
ELIMINATOR HOLE APEX DEPUY (Hips) ×2 IMPLANT
FACESHIELD WRAPAROUND (MASK) ×6 IMPLANT
FACESHIELD WRAPAROUND OR TEAM (MASK) ×2 IMPLANT
GAUZE SPONGE 4X4 12PLY STRL (GAUZE/BANDAGES/DRESSINGS) ×2 IMPLANT
GAUZE XEROFORM 5X9 LF (GAUZE/BANDAGES/DRESSINGS) ×2 IMPLANT
GLOVE BIOGEL PI IND STRL 8 (GLOVE) ×2 IMPLANT
GLOVE BIOGEL PI INDICATOR 8 (GLOVE) ×4
GLOVE ORTHO TXT STRL SZ7.5 (GLOVE) ×6 IMPLANT
GOWN STRL REUS W/ TWL LRG LVL3 (GOWN DISPOSABLE) ×1 IMPLANT
GOWN STRL REUS W/ TWL XL LVL3 (GOWN DISPOSABLE) ×1 IMPLANT
GOWN STRL REUS W/TWL 2XL LVL3 (GOWN DISPOSABLE) ×3 IMPLANT
GOWN STRL REUS W/TWL LRG LVL3 (GOWN DISPOSABLE) ×3
GOWN STRL REUS W/TWL XL LVL3 (GOWN DISPOSABLE) ×3
GRIPTON 100 52 (Orthopedic Implant) ×3 IMPLANT
HEAD CERAMIC DELTA 36 PLUS 1.5 (Hips) ×2 IMPLANT
KIT BASIN OR (CUSTOM PROCEDURE TRAY) ×3 IMPLANT
KIT TURNOVER KIT B (KITS) ×3 IMPLANT
LINER NEUTRAL 52X36MM PLUS 4 (Liner) ×2 IMPLANT
MANIFOLD NEPTUNE II (INSTRUMENTS) ×3 IMPLANT
NS IRRIG 1000ML POUR BTL (IV SOLUTION) ×3 IMPLANT
PACK TOTAL JOINT (CUSTOM PROCEDURE TRAY) ×3 IMPLANT
PAD ARMBOARD 7.5X6 YLW CONV (MISCELLANEOUS) ×6 IMPLANT
RETRACTOR WND ALEXIS 18 MED (MISCELLANEOUS) ×1 IMPLANT
RTRCTR WOUND ALEXIS 18CM MED (MISCELLANEOUS) ×3
SPONGE LAP 18X18 RF (DISPOSABLE) ×2 IMPLANT
STAPLER VISISTAT 35W (STAPLE) ×2 IMPLANT
STEM FEM ACTIS STD SZ4 (Stem) ×2 IMPLANT
STRIP CLOSURE SKIN 1/2X4 (GAUZE/BANDAGES/DRESSINGS) ×2 IMPLANT
SUT VIC AB 0 CT1 27 (SUTURE) ×6
SUT VIC AB 0 CT1 27XBRD ANBCTR (SUTURE) ×1 IMPLANT
SUT VIC AB 2-0 CT1 27 (SUTURE) ×6
SUT VIC AB 2-0 CT1 TAPERPNT 27 (SUTURE) ×1 IMPLANT
SUT VICRYL 4-0 PS2 18IN ABS (SUTURE) ×3 IMPLANT
SUT VLOC 180 0 24IN GS25 (SUTURE) ×3 IMPLANT
SYR CONTROL 10ML LL (SYRINGE) ×2 IMPLANT
TAPE CLOTH SURG 6X10 WHT LF (GAUZE/BANDAGES/DRESSINGS) ×2 IMPLANT
TOWEL GREEN STERILE (TOWEL DISPOSABLE) ×6 IMPLANT
TOWEL GREEN STERILE FF (TOWEL DISPOSABLE) ×3 IMPLANT
TRAY CATH 16FR W/PLASTIC CATH (SET/KITS/TRAYS/PACK) IMPLANT
TRAY FOLEY MTR SLVR 16FR STAT (SET/KITS/TRAYS/PACK) IMPLANT
WATER STERILE IRR 1000ML POUR (IV SOLUTION) ×4 IMPLANT

## 2019-02-15 NOTE — Anesthesia Procedure Notes (Signed)
Spinal  Patient location during procedure: OR Start time: 02/15/2019 12:47 PM End time: 02/15/2019 12:50 PM Staffing Anesthesiologist: Audry Pili, MD Performed: anesthesiologist  Preanesthetic Checklist Completed: patient identified, surgical consent, pre-op evaluation, timeout performed, IV checked, risks and benefits discussed and monitors and equipment checked Spinal Block Patient position: sitting Prep: DuraPrep Patient monitoring: heart rate, cardiac monitor, continuous pulse ox and blood pressure Approach: midline Location: L3-4 Injection technique: single-shot Needle Needle type: Quincke  Needle gauge: 22 G Additional Notes Consent was obtained prior to the procedure with all questions answered and concerns addressed. Risks including, but not limited to, bleeding, infection, nerve damage, paralysis, failed block, inadequate analgesia, allergic reaction, high spinal, itching, and headache were discussed and the patient wished to proceed. Functioning IV was confirmed and monitors were applied. Sterile prep and drape, including hand hygiene, mask, and sterile gloves were used. The patient was positioned and the spine was prepped. The skin was anesthetized with lidocaine. Free flow of clear CSF was obtained prior to injecting local anesthetic into the CSF. The spinal needle aspirated freely following injection. The needle was carefully withdrawn. The patient tolerated the procedure well.   Renold Don, MD

## 2019-02-15 NOTE — Op Note (Signed)
Preop diagnosis: Right hip primary osteoarthritis  Postop diagnosis: Same  Procedure: Right total hip arthroplasty direct anterior approach  Surgeon: Rodell Perna, MD  Assistant: Benjiman Core, PA-C medically necessary and present for the entire procedure  Anesthesia spinal.  Plus Marcaine skin local  EBL 400 cc  Implants Depuy Gripton 100 52 mm cup +4.  Apex illuminator.  +4 neutral liner 3652.  Actis standard size 4 stem.  +1.5 ceramic ball.  Procedure: After induction of spinal anesthesia patient was placed on the Hana table with Hana boots.  C arm was brought in good visualization and leg lengths were within 1 to 10 mm of equal.  1015 drapes were applied proximal distal DuraPrep repeat 10 sterile 15 drapes and large shower curtain Betadine Steri-Drape application.  Half sheet across half sheet above.  Sterile skin marker had been used prior to application of the Betadine Steri-Drape.  Ancef was given prophylactically timeout procedure was completed.  Direct anterior approach was made starting 2 cm lateral to centimeters inferior to the ASIS.  Obliquely extending to the trochanter.  Subtendinous tissue divided skin protector applied fascia was identified caught with an Allis clamp and over the anterior capsule with dull cobra was placed soft tissue was removed remove some adipose tissue coagulating transverse bleeders anterior capsule was opened and dull cobra was placed inside medially.  More anterior capsule was resected home was placed superiorly over the neck and neck was cut under C-arm visualization for appropriate level of the tach.  Completion of the cut was made with an osteotome superior to avoid damage to the trochanter.  Head was moved with a corkscrew sequential reaming up to 5152 cup was placed final impaction done with C-arm visualization for appropriate cup flexion and abduction.  Hydraulic hook was applied to the femur leg was taken down and under externally rotated 120 and then  sequential preparation of the femur progressing up to a #4 which gave a tight fit of the canal.  Patient and significant hip osteoarthritis with bony erosion and posterior capsule had to be released in order for the femur to come lateral enough for entry.  After the hip was reduced C arm was checked there was good stability trace shock good stability at 45 degrees extension external rotation 45 degrees..  Patient was maybe 1 to 2 mm longer on the opposite leg.  There was good fit and fill of the canal.  Chronic bowel was applied after insertion of the #4 Actis stem.  Ceramic ball was applied 1.5 mm impacted checked and then hip was reduced.  There was some bleeding in the posterior capsular region and we sequentially backslash sponges exchange it waited a while and then got some TXA from the pharmacy placing a sponge placed this back posteriorly underneath the neck for period of time.  There is still some oozing posteriorly but posterior capsule area that was bleeding was not able to be visualized since it was on the other side of the neck.  Bruising is slowed down over a period of 10 to 15 minutes his packing was exchanged and the TXA was used.  V lock fascial closure ~subtendinous tissue skin staple closure postop dressing and transferred recovery room.  Instrument count needle count was correct.

## 2019-02-15 NOTE — Transfer of Care (Signed)
Immediate Anesthesia Transfer of Care Note  Patient: Jamie Rollins  Procedure(s) Performed: RIGHT TOTAL HIP ARTHROPLASTY DIRECT ANTERIOR (Right Hip)  Patient Location: PACU  Anesthesia Type:Spinal  Level of Consciousness: awake, drowsy and patient cooperative  Airway & Oxygen Therapy: Patient Spontanous Breathing and Patient connected to nasal cannula oxygen  Post-op Assessment: Report given to RN and Post -op Vital signs reviewed and stable  Post vital signs: Reviewed and stable  Last Vitals:  Vitals Value Taken Time  BP 83/52 02/15/19 1527  Temp    Pulse 79 02/15/19 1529  Resp 18 02/15/19 1530  SpO2 100 % 02/15/19 1529  Vitals shown include unvalidated device data.  Last Pain:  Vitals:   02/15/19 1053  TempSrc:   PainSc: 0-No pain         Complications: No apparent anesthesia complications

## 2019-02-15 NOTE — Progress Notes (Signed)
Got one unit PRBC's started in PACU due to lower pressure and Hgb of 7.3 with pre-op 10.8.   Vitals stable right now. She has a Hgb pending.

## 2019-02-15 NOTE — Anesthesia Procedure Notes (Signed)
Procedure Name: MAC Date/Time: 02/15/2019 12:50 PM Performed by: Renato Shin, CRNA Pre-anesthesia Checklist: Patient identified, Emergency Drugs available, Suction available and Patient being monitored Patient Re-evaluated:Patient Re-evaluated prior to induction Oxygen Delivery Method: Nasal cannula Preoxygenation: Pre-oxygenation with 100% oxygen Induction Type: IV induction Placement Confirmation: positive ETCO2 and breath sounds checked- equal and bilateral Dental Injury: Teeth and Oropharynx as per pre-operative assessment

## 2019-02-15 NOTE — Telephone Encounter (Signed)
See below

## 2019-02-15 NOTE — Interval H&P Note (Signed)
History and Physical Interval Note:  02/15/2019 12:38 PM  Jamie Rollins  has presented today for surgery, with the diagnosis of right hip osteoarthritis.  The various methods of treatment have been discussed with the patient and family. After consideration of risks, benefits and other options for treatment, the patient has consented to  Procedure(s): RIGHT TOTAL HIP ARTHROPLASTY DIRECT ANTERIOR (Right) as a surgical intervention.  The patient's history has been reviewed, patient examined, no change in status, stable for surgery.  I have reviewed the patient's chart and labs.  Questions were answered to the patient's satisfaction.     [name]

## 2019-02-16 ENCOUNTER — Encounter (HOSPITAL_COMMUNITY): Payer: Self-pay | Admitting: General Practice

## 2019-02-16 LAB — CBC
HCT: 23.6 % — ABNORMAL LOW (ref 36.0–46.0)
Hemoglobin: 7.6 g/dL — ABNORMAL LOW (ref 12.0–15.0)
MCH: 31.8 pg (ref 26.0–34.0)
MCHC: 32.2 g/dL (ref 30.0–36.0)
MCV: 98.7 fL (ref 80.0–100.0)
Platelets: 114 10*3/uL — ABNORMAL LOW (ref 150–400)
RBC: 2.39 MIL/uL — ABNORMAL LOW (ref 3.87–5.11)
RDW: 14.4 % (ref 11.5–15.5)
WBC: 4.6 10*3/uL (ref 4.0–10.5)
nRBC: 0.4 % — ABNORMAL HIGH (ref 0.0–0.2)

## 2019-02-16 LAB — PREPARE RBC (CROSSMATCH)

## 2019-02-16 LAB — BASIC METABOLIC PANEL
Anion gap: 10 (ref 5–15)
BUN: 14 mg/dL (ref 8–23)
CO2: 21 mmol/L — ABNORMAL LOW (ref 22–32)
Calcium: 9.3 mg/dL (ref 8.9–10.3)
Chloride: 106 mmol/L (ref 98–111)
Creatinine, Ser: 1.14 mg/dL — ABNORMAL HIGH (ref 0.44–1.00)
GFR calc Af Amer: 54 mL/min — ABNORMAL LOW (ref >60)
GFR calc non Af Amer: 47 mL/min — ABNORMAL LOW (ref >60)
Glucose, Bld: 144 mg/dL — ABNORMAL HIGH (ref 70–99)
Potassium: 4.5 mmol/L (ref 3.5–5.1)
Sodium: 137 mmol/L (ref 135–145)

## 2019-02-16 LAB — HEMOGLOBIN AND HEMATOCRIT, BLOOD
HCT: 29.4 % — ABNORMAL LOW (ref 36.0–46.0)
Hemoglobin: 9.8 g/dL — ABNORMAL LOW (ref 12.0–15.0)

## 2019-02-16 MED ORDER — SODIUM CHLORIDE 0.9% IV SOLUTION
Freq: Once | INTRAVENOUS | Status: AC
  Administered 2019-02-16: 08:00:00 via INTRAVENOUS

## 2019-02-16 NOTE — Evaluation (Signed)
Physical Therapy Evaluation Patient Details Name: Jamie Rollins MRN: [id number] DOB: 07/02/1941 Today's Date: 02/16/2019   History of Present Illness  Pt is a 78 y.o. female s/p R THA.  Clinical Impression  Pt is s/p THA resulting in the deficits listed below (see PT Problem List). PTA pt independent with mobility and ADLs. On eval, she required min assist transfers and min assist ambulation 20 feet x 2 with RW. Mobility limited by pain and nausea. Pt will benefit from skilled PT to increase their independence and safety with mobility to allow discharge to the venue listed below.      Follow Up Recommendations Supervision/Assistance - 24 hour;Home health PT    Equipment Recommendations  None recommended by PT    Recommendations for Other Services       Precautions / Restrictions Precautions Precautions: Fall Restrictions Weight Bearing Restrictions: Yes RLE Weight Bearing: Weight bearing as tolerated      Mobility  Bed Mobility Overal bed mobility: Needs Assistance             General bed mobility comments: Pt received in recliner.  Transfers Overall transfer level: Needs assistance Equipment used: Rolling walker (2 wheeled) Transfers: Sit to/from Stand Sit to Stand: Min assist         General transfer comment: cues for hand placement, assist to power up  Ambulation/Gait Ambulation/Gait assistance: Min assist Gait Distance (Feet): 20 Feet(x2) Assistive device: Rolling walker (2 wheeled) Gait Pattern/deviations: Step-to pattern;Decreased stride length Gait velocity: decreased   General Gait Details: cues for sequencing, step length and RW management. Distance limited by pain, nausea and dizziness.  Stairs            Wheelchair Mobility    Modified Rankin (Stroke Patients Only)       Balance Overall balance assessment: Needs assistance Sitting-balance support: No upper extremity supported;Feet supported Sitting balance-Leahy Scale: Good      Standing balance support: Bilateral upper extremity supported;During functional activity Standing balance-Leahy Scale: Poor Standing balance comment: reliant on RW                             Pertinent Vitals/Pain Pain Assessment: 0-10 Pain Score: 10-Worst pain ever Pain Location: R hip Pain Descriptors / Indicators: Grimacing;Guarding;Aching;Sore Pain Intervention(s): Monitored during session;Limited activity within patient's tolerance;Repositioned    Home Living Family/patient expects to be discharged to:: Private residence Living Arrangements: Spouse/significant other Available Help at Discharge: Family;Available 24 hours/day Type of Home: House Home Access: Stairs to enter Entrance Stairs-Rails: LawyerLeft;Right Entrance Stairs-Number of Steps: 2 Home Layout: One level Home Equipment: Cane - single point Additional Comments: Pt plans to borrow 3n1 and RW from a friend. Husband plans to buy shower seat.    Prior Function Level of Independence: Independent         Comments: occassional use of cane due to hip pain. Drives, grocery shops, cleans house.     Hand Dominance        Extremity/Trunk Assessment   Upper Extremity Assessment Upper Extremity Assessment: Defer to OT evaluation    Lower Extremity Assessment Lower Extremity Assessment: RLE deficits/detail RLE Deficits / Details: s/p THA (direct anterior) RLE: Unable to fully assess due to pain       Communication   Communication: No difficulties  Cognition Arousal/Alertness: Awake/alert Behavior During Therapy: WFL for tasks assessed/performed Overall Cognitive Status: Within Functional Limits for tasks assessed  General Comments General comments (skin integrity, edema, etc.): Husband and sister present in room during session. They will both be assisting pt at d/c.    Exercises Total Joint Exercises Ankle Circles/Pumps: AROM;Both;10  reps Heel Slides: AAROM;Right;10 reps Hip ABduction/ADduction: AAROM;Right;10 reps   Assessment/Plan    PT Assessment Patient needs continued PT services  PT Problem List Decreased strength;Decreased mobility;Decreased activity tolerance;Decreased balance;Decreased knowledge of use of DME;Pain       PT Treatment Interventions DME instruction;Therapeutic activities;Gait training;Therapeutic exercise;Patient/family education;Stair training;Balance training;Functional mobility training    PT Goals (Current goals can be found in the Care Plan section)  Acute Rehab PT Goals Patient Stated Goal: feel better PT Goal Formulation: With patient/family Time For Goal Achievement: 02/23/19 Potential to Achieve Goals: Good    Frequency 7X/week   Barriers to discharge        Co-evaluation               AM-PAC PT "6 Clicks" Mobility  Outcome Measure Help needed turning from your back to your side while in a flat bed without using bedrails?: A Little Help needed moving from lying on your back to sitting on the side of a flat bed without using bedrails?: A Little Help needed moving to and from a bed to a chair (including a wheelchair)?: A Little Help needed standing up from a chair using your arms (e.g., wheelchair or bedside chair)?: A Little Help needed to walk in hospital room?: A Little Help needed climbing 3-5 steps with a railing? : A Lot 6 Click Score: 17    End of Session Equipment Utilized During Treatment: Gait belt Activity Tolerance: Patient tolerated treatment well Patient left: in chair;with family/visitor present(Family instructed to notify RN when they leave as pt unable to reach call button. Verbalized understanding.) Nurse Communication: Mobility status PT Visit Diagnosis: Other abnormalities of gait and mobility (R26.89);Pain Pain - Right/Left: Right Pain - part of body: Hip    Time: 5409-8119 PT Time Calculation (min) (ACUTE ONLY): 28 min   Charges:   PT  Evaluation $PT Eval Moderate Complexity: 1 Mod PT Treatments $Gait Training: 8-22 mins        Lorrin Goodell, PT  Office # [phone] Pager [phone]   Jamie Rollins 02/16/2019, 1:49 PM

## 2019-02-16 NOTE — Anesthesia Postprocedure Evaluation (Signed)
Anesthesia Post Note  Patient: ALEXEI EY  Procedure(s) Performed: RIGHT TOTAL HIP ARTHROPLASTY DIRECT ANTERIOR (Right Hip)     Patient location during evaluation: PACU Anesthesia Type: Spinal Level of consciousness: awake Pain management: pain level controlled Vital Signs Assessment: post-procedure vital signs reviewed and stable Respiratory status: spontaneous breathing, nonlabored ventilation, respiratory function stable and patient connected to nasal cannula oxygen Cardiovascular status: blood pressure returned to baseline and stable Postop Assessment: no apparent nausea or vomiting and spinal receding Anesthetic complications: no    Last Vitals:  Vitals:   02/15/19 2349 02/16/19 0520  BP: (!) 120/48 (!) 136/53  Pulse: 91 98  Resp: 15 17  Temp: 37.3 C 37.1 C  SpO2: 98% 96%                   Audry Pili

## 2019-02-16 NOTE — Progress Notes (Signed)
Patient didn't void as at midnight, bladder scan was 141 ml, IV fluid infusing at 100 ml/hr now, will continue to monitor.

## 2019-02-16 NOTE — Plan of Care (Signed)
  Problem: Clinical Measurements: Goal: Ability to maintain clinical measurements within normal limits will improve Outcome: Progressing   Problem: Clinical Measurements: Goal: Diagnostic test results will improve Outcome: Progressing   Problem: Clinical Measurements: Goal: Will remain free from infection Outcome: Progressing   

## 2019-02-16 NOTE — Progress Notes (Signed)
PT Cancellation Note  Patient Details Name: Jamie Rollins MRN: [id number] DOB: 1941/07/08   Cancelled Treatment:    Reason Eval/Treat Not Completed: Medical issues which prohibited therapy. Pt feeling poorly this AM, high pain as well as 7.6 Hgb. RN just gave dilaudid. Pt scheduled to receive 1 unit PRBC today. PT to re-attempt after transfusion.   Lorriane Shire 02/16/2019, 8:56 AM  Lorrin Goodell, PT  Office # [phone] Pager [phone]

## 2019-02-16 NOTE — Progress Notes (Signed)
CSW acknowledges SNF consult. The patient has pending PT/OT evaluations. Once PT/OT has completed their evaluations, CSW will be able to assist with disposition planning.   CSW will continue to follow.   Domenic Schwab, MSW, [place] Worker [facility]  [phone]

## 2019-02-17 LAB — BASIC METABOLIC PANEL
Anion gap: 8 (ref 5–15)
BUN: 14 mg/dL (ref 8–23)
CO2: 23 mmol/L (ref 22–32)
Calcium: 10.3 mg/dL (ref 8.9–10.3)
Chloride: 103 mmol/L (ref 98–111)
Creatinine, Ser: 1.01 mg/dL — ABNORMAL HIGH (ref 0.44–1.00)
GFR calc Af Amer: >60 mL/min (ref >60)
GFR calc non Af Amer: 54 mL/min — ABNORMAL LOW (ref >60)
Glucose, Bld: 123 mg/dL — ABNORMAL HIGH (ref 70–99)
Potassium: 4.1 mmol/L (ref 3.5–5.1)
Sodium: 134 mmol/L — ABNORMAL LOW (ref 135–145)

## 2019-02-17 LAB — BPAM RBC
Blood Product Expiration Date: 202008092359
Blood Product Expiration Date: 202008292359
ISSUE DATE / TIME: 202008051628
ISSUE DATE / TIME: 202008060849
Unit Type and Rh: 5100
Unit Type and Rh: 5100

## 2019-02-17 LAB — TYPE AND SCREEN
ABO/RH(D): O POS
Antibody Screen: NEGATIVE
Unit division: 0
Unit division: 0

## 2019-02-17 LAB — CBC
HCT: 27.1 % — ABNORMAL LOW (ref 36.0–46.0)
Hemoglobin: 9.1 g/dL — ABNORMAL LOW (ref 12.0–15.0)
MCH: 31.3 pg (ref 26.0–34.0)
MCHC: 33.6 g/dL (ref 30.0–36.0)
MCV: 93.1 fL (ref 80.0–100.0)
Platelets: 116 10*3/uL — ABNORMAL LOW (ref 150–400)
RBC: 2.91 MIL/uL — ABNORMAL LOW (ref 3.87–5.11)
RDW: 14.8 % (ref 11.5–15.5)
WBC: 8.1 10*3/uL (ref 4.0–10.5)
nRBC: 0 % (ref 0.0–0.2)

## 2019-02-17 MED ORDER — ASPIRIN EC 325 MG PO TBEC: 325.0000 mg | DELAYED_RELEASE_TABLET | Freq: Every day | ORAL | 0 refills | Status: DC

## 2019-02-17 MED ORDER — HYDROCODONE-ACETAMINOPHEN 10-325 MG PO TABS: 1.0000 | ORAL_TABLET | Freq: Four times a day (QID) | ORAL | 0 refills | Status: DC | PRN

## 2019-02-17 NOTE — Progress Notes (Signed)
Physical Therapy Treatment Patient Details Name: Jamie Rollins MRN: [id number] DOB: [birth year] Today's Date: 02/17/2019    History of Present Illness Pt is a 78 y.o. female s/p R THA.    PT Comments    Pt received in recliner, agreeable to participation in therapy. She required min guard assist transfers and ambulation 175 feet with RW. Pt performed LE exercises reclined with feet elevated in recliner. HEP handouts provided.  Good mobility progress noted.    Follow Up Recommendations  Supervision/Assistance - 24 hour;Home health PT     Equipment Recommendations  None recommended by PT    Recommendations for Other Services       Precautions / Restrictions Precautions Precautions: Fall Restrictions Weight Bearing Restrictions: Yes RLE Weight Bearing: Weight bearing as tolerated    Mobility  Bed Mobility               General bed mobility comments: Pt received in recliner.  Transfers Overall transfer level: Needs assistance Equipment used: Rolling walker (2 wheeled) Transfers: Sit to/from Stand Sit to Stand: Min guard         General transfer comment: cues for sequencing, increased time  Ambulation/Gait Ambulation/Gait assistance: Min guard Gait Distance (Feet): 175 Feet Assistive device: Rolling walker (2 wheeled) Gait Pattern/deviations: Step-through pattern;Decreased stride length;Decreased stance time - right Gait velocity: decreased Gait velocity interpretation: 1.31 - 2.62 ft/sec, indicative of limited community ambulator General Gait Details: cues for step length   Stairs             Wheelchair Mobility    Modified Rankin (Stroke Patients Only)       Balance Overall balance assessment: Needs assistance Sitting-balance support: No upper extremity supported;Feet supported Sitting balance-Leahy Scale: Good     Standing balance support: Bilateral upper extremity supported;During functional activity Standing balance-Leahy Scale:  Poor Standing balance comment: reliant on RW                            Cognition Arousal/Alertness: Awake/alert Behavior During Therapy: WFL for tasks assessed/performed Overall Cognitive Status: Within Functional Limits for tasks assessed                                        Exercises Total Joint Exercises Ankle Circles/Pumps: AROM;Both;10 reps Quad Sets: AROM;Both;10 reps Heel Slides: AAROM;Right;10 reps Hip ABduction/ADduction: AAROM;Right;10 reps    General Comments        Pertinent Vitals/Pain Pain Assessment: Faces Faces Pain Scale: Hurts even more Pain Location: R hip Pain Descriptors / Indicators: Grimacing;Guarding Pain Intervention(s): Monitored during session;Repositioned    Home Living                      Prior Function            PT Goals (current goals can now be found in the care plan section) Acute Rehab PT Goals Patient Stated Goal: home PT Goal Formulation: With patient/family Time For Goal Achievement: 02/23/19 Potential to Achieve Goals: Good Progress towards PT goals: Progressing toward goals    Frequency    7X/week      PT Plan Current plan remains appropriate    Co-evaluation              AM-PAC PT "6 Clicks" Mobility   Outcome Measure  Help needed turning from your back to your  side while in a flat bed without using bedrails?: A Little Help needed moving from lying on your back to sitting on the side of a flat bed without using bedrails?: A Little Help needed moving to and from a bed to a chair (including a wheelchair)?: A Little Help needed standing up from a chair using your arms (e.g., wheelchair or bedside chair)?: A Little Help needed to walk in hospital room?: A Little Help needed climbing 3-5 steps with a railing? : A Lot 6 Click Score: 17    End of Session Equipment Utilized During Treatment: Gait belt Activity Tolerance: Patient tolerated treatment well Patient left: in  chair;with chair alarm set;with call bell/phone within reach Nurse Communication: Mobility status PT Visit Diagnosis: Other abnormalities of gait and mobility (R26.89);Pain Pain - Right/Left: Right Pain - part of body: Hip     Time: 0810-0840 PT Time Calculation (min) (ACUTE ONLY): 30 min  Charges:  $Gait Training: 8-22 mins $Therapeutic Exercise: 8-22 mins                     Aida RaiderWendy Romond Pipkins, PT  Office # [phone] Pager [phone]    Ilda FoilGarrow, Kilah Drahos Rene 02/17/2019, 9:32 AM

## 2019-02-17 NOTE — Progress Notes (Addendum)
Subjective: Patient doing well.  Good pain control.  She did receive blood transfusion and denies any feeling of lightheadedness, dizziness.  She did good ambulation in the hall today.  Patient still has her right arm arterial line.   Objective: Vital signs in last 24 hours: Temp:  [98 F (36.7 C)-100.4 F (38 C)] 99.1 F (37.3 C) (08/07 0315) Pulse Rate:  [86-97] 97 (08/07 0900) Resp:  [13-16] 13 (08/07 0900) BP: (127-149)/(52-59) 149/59 (08/07 0315) SpO2:  [92 %-96 %] 96 % (08/07 0900)  Intake/Output from previous day: 08/06 0701 - 08/07 0700 In: 840 [P.O.:840] Out: 1300 [Urine:1300] Intake/Output this shift: Total I/O In: 240 [P.O.:240] Out: -   Recent Labs    02/15/19 1527 02/15/19 2119 02/16/19 0530 02/16/19 1705 02/17/19 0323  HGB 7.3* 8.0* 7.6* 9.8* 9.1*   Recent Labs    02/16/19 0530 02/16/19 1705 02/17/19 0323  WBC 4.6  --  8.1  RBC 2.39*  --  2.91*  HCT 23.6* 29.4* 27.1*  PLT 114*  --  116*   Recent Labs    02/15/19 2119 02/16/19 0530  NA 135 137  K 4.7 4.5  CL 106 106  CO2 23 21*  BUN 15 14  CREATININE 1.14* 1.14*  GLUCOSE 132* 144*  CALCIUM 9.3 9.3   No results for input(s): LABPT, INR in the last 72 hours.  Exam Pleasant elderly white female alert and oriented in no acute distress.  Right hip wound looks good.  Staples intact.  No drainage or signs of infection.  Calf nontender.  Neurovascularly intact.  She does have right upper extremity arterial line.   Assessment/Plan: Patient making progress with PT.  Anticipate discharge home tomorrow.  We will continue to monitor hemoglobin. Scripts on chart for norco and aspirin.      Benjiman Core 02/17/2019, 10:13 AM

## 2019-02-17 NOTE — Evaluation (Signed)
Occupational Therapy Evaluation and Discharge Patient Details Name: Jamie Rollins MRN: [id number] DOB: 06-Oct-1940 Today's Date: 02/17/2019    History of Present Illness Pt is a 78 y.o. female s/p R THA.   Clinical Impression   Pt was independent prior to admission. Presents with mild R hip pain and impaired ambulation requiring min guard assist. She moves quickly. Educated pt and her family in use of AE and DME use, compensatory strategies for LB ADL and fall prevention. Pt and family verbalized understanding. No further OT needs.    Follow Up Recommendations  No OT follow up    Equipment Recommendations  3 in 1 bedside commode(pt may be borrowing one)    Recommendations for Other Services       Precautions / Restrictions Precautions Precautions: Fall Restrictions Weight Bearing Restrictions: Yes RLE Weight Bearing: Weight bearing as tolerated      Mobility Bed Mobility Overal bed mobility: Needs Assistance Bed Mobility: Supine to Sit     Supine to sit: Min assist    General bed mobility comments: min assist to manage R LE, increased time, cues for technique  Transfers Overall transfer level: Needs assistance Equipment used: Rolling walker (2 wheeled) Transfers: Sit to/from Stand Sit to Stand: Supervision         General transfer comment: good technique from bed and toilet    Balance Overall balance assessment: Needs assistance Sitting-balance support: No upper extremity supported;Feet supported Sitting balance-Leahy Scale: Good     Standing balance support: Bilateral upper extremity supported;During functional activity Standing balance-Leahy Scale: Fair Standing balance comment: can release walker at sink without LOB                           ADL either performed or assessed with clinical judgement   ADL Overall ADL's : Needs assistance/impaired Eating/Feeding: Independent;Sitting   Grooming: Wash/dry hands;Standing;Supervision/safety    Upper Body Bathing: Set up;Sitting   Lower Body Bathing: Moderate assistance;Sit to/from stand Lower Body Bathing Details (indicate cue type and reason): recommended long handled bath sponge and reacher to wash and dry LEs Upper Body Dressing : Set up;Sitting   Lower Body Dressing: Moderate assistance;Sit to/from stand Lower Body Dressing Details (indicate cue type and reason): educated in use of reacher and sock aide and safe footwear Toilet Transfer: Min guard;Ambulation;RW;Comfort height toilet;Grab bars   Toileting- Clothing Manipulation and Hygiene: Supervision/safety;Sit to/from stand       Functional mobility during ADLs: Min guard;Rolling walker General ADL Comments: instructed in fabrication of walker bag or basket     Vision Patient Visual Report: No change from baseline       Perception     Praxis      Pertinent Vitals/Pain Pain Assessment: Faces Pain Score: 7  Faces Pain Scale: Hurts a little bit Pain Location: R hip Pain Descriptors / Indicators: Grimacing;Guarding Pain Intervention(s): Ice applied     Hand Dominance Right   Extremity/Trunk Assessment Upper Extremity Assessment Upper Extremity Assessment: Overall WFL for tasks assessed   Lower Extremity Assessment Lower Extremity Assessment: Defer to PT evaluation       Communication Communication Communication: No difficulties   Cognition Arousal/Alertness: Awake/alert Behavior During Therapy: WFL for tasks assessed/performed Overall Cognitive Status: Within Functional Limits for tasks assessed  General Comments       Exercises     Shoulder Instructions      Home Living Family/patient expects to be discharged to:: Private residence Living Arrangements: Spouse/significant other Available Help at Discharge: Family;Available 24 hours/day Type of Home: House Home Access: Stairs to enter Entergy CorporationEntrance Stairs-Number of Steps: 2 Entrance  Stairs-Rails: Left;Right Home Layout: One level     Bathroom Shower/Tub: Chief Strategy OfficerTub/shower unit   Bathroom Toilet: Standard     Home Equipment: Cane - single point;Grab bars - tub/shower;Hand held shower head   Additional Comments: husband plans to install a shower seat.      Prior Functioning/Environment Level of Independence: Independent        Comments: occassional use of cane due to hip pain. Drives, grocery shops, cleans house.        OT Problem List: Impaired balance (sitting and/or standing);Decreased knowledge of use of DME or AE;Increased edema      OT Treatment/Interventions:      OT Goals(Current goals can be found in the care plan section) Acute Rehab OT Goals Patient Stated Goal: home  OT Frequency:     Barriers to D/C:            Co-evaluation              AM-PAC OT "6 Clicks" Daily Activity     Outcome Measure Help from another person eating meals?: None Help from another person taking care of personal grooming?: A Little Help from another person toileting, which includes using toliet, bedpan, or urinal?: A Little Help from another person bathing (including washing, rinsing, drying)?: A Lot Help from another person to put on and taking off regular upper body clothing?: None Help from another person to put on and taking off regular lower body clothing?: A Lot 6 Click Score: 18   End of Session Equipment Utilized During Treatment: Gait belt;Rolling walker  Activity Tolerance: Patient tolerated treatment well Patient left: in chair;with call bell/phone within reach;with chair alarm set;with family/visitor present  OT Visit Diagnosis: Other abnormalities of gait and mobility (R26.89);Pain                Time: [id number] OT Time Calculation (min): 51 min Charges:  OT General Charges $OT Visit: 1 Visit OT Evaluation $OT Eval Moderate Complexity: 1 Mod OT Treatments $Self Care/Home Management : 23-37 mins  Martie RoundJulie Nicholi Ghuman, OTR/L Acute  Rehabilitation Services Pager: [phone] Office: [phone]  Evern BioMayberry, Chika Cichowski Lynn 02/17/2019, 1:45 PM

## 2019-02-17 NOTE — Discharge Instructions (Addendum)
INSTRUCTIONS AFTER RIGHT TOTAL HIP JOINT REPLACEMENT   o Remove items at home which could result in a fall. This includes throw rugs or furniture in walking pathways o ICE to the affected joint every three hours while awake for 30 minutes at a time, for at least the first 3-5 days, and then as needed for pain and swelling.  Continue to use ice for pain and swelling. You may notice swelling that will progress down to the foot and ankle.  This is normal after surgery.  Elevate your leg when you are not up walking on it.   o Continue to use the breathing machine you got in the hospital (incentive spirometer) which will help keep your temperature down.  It is common for your temperature to cycle up and down following surgery, especially at night when you are not up moving around and exerting yourself.  The breathing machine keeps your lungs expanded and your temperature down.   DIET:  As you were doing prior to hospitalization, we recommend a well-balanced diet.  DRESSING / WOUND CARE / SHOWERING  You may change your dressing 3-5 days after surgery.  Then change the dressing every day with sterile gauze.  Please use good hand washing techniques before changing the dressing.  Do not use any lotions or creams on the incision until instructed by your surgeon. NO TUB SOAKING.   ACTIVITY  o Increase activity slowly as tolerated, but follow the weight bearing instructions below.   o No driving for 6 weeks or until further direction given by your physician.  You cannot drive while taking narcotics.  o No lifting or carrying greater than 10 lbs. until further directed by your surgeon. o Avoid periods of inactivity such as sitting longer than an hour when not asleep. This helps prevent blood clots.  o You may return to work once you are authorized by your doctor.     WEIGHT BEARING   Weight bearing as tolerated with assist device (walker, cane, etc) as directed, use it as long as suggested by your  surgeon or therapist, typically at least 4-6 weeks.   EXERCISES  Results after joint replacement surgery are often greatly improved when you follow the exercise, range of motion and muscle strengthening exercises prescribed by your doctor. Safety measures are also important to protect the joint from further injury. Any time any of these exercises cause you to have increased pain or swelling, decrease what you are doing until you are comfortable again and then slowly increase them. If you have problems or questions, call your caregiver or physical therapist for advice.   Rehabilitation is important following a joint replacement. After just a few days of immobilization, the muscles of the leg can become weakened and shrink (atrophy).  These exercises are designed to build up the tone and strength of the thigh and leg muscles and to improve motion. Often times heat used for twenty to thirty minutes before working out will loosen up your tissues and help with improving the range of motion but do not use heat for the first two weeks following surgery (sometimes heat can increase post-operative swelling).   These exercises can be done on a training (exercise) mat, on the floor, on a table or on a bed. Use whatever works the best and is most comfortable for you.    Use music or television while you are exercising so that the exercises are a pleasant break in your day. This will make your life better  with the exercises acting as a break in your routine that you can look forward to.   Perform all exercises about fifteen times, three times per day or as directed.  You should exercise both the operative leg and the other leg as well.  A rehabilitation program following joint replacement surgery can speed recovery and prevent re-injury in the future due to weakened muscles. Contact your doctor or a physical therapist for more information on knee rehabilitation.    CONSTIPATION  Constipation is defined medically  as fewer than three stools per week and severe constipation as less than one stool per week.  Even if you have a regular bowel pattern at home, your normal regimen is likely to be disrupted due to multiple reasons following surgery.  Combination of anesthesia, postoperative narcotics, change in appetite and fluid intake all can affect your bowels.   YOU MUST use at least one of the following options; they are listed in order of increasing strength to get the job done.  They are all available over the counter, and you may need to use some, POSSIBLY even all of these options:    Drink plenty of fluids (prune juice may be helpful) and high fiber foods Colace 100 mg by mouth twice a day  Senokot for constipation as directed and as needed Dulcolax (bisacodyl), take with full glass of water  Miralax (polyethylene glycol) once or twice a day as needed.  If you have tried all these things and are unable to have a bowel movement in the first 3-4 days after surgery call either your surgeon or your primary doctor.    If you experience loose stools or diarrhea, hold the medications until you stool forms back up.  If your symptoms do not get better within 1 week or if they get worse, check with your doctor.  If you experience "the worst abdominal pain ever" or develop nausea or vomiting, please contact the office immediately for further recommendations for treatment.   ITCHING:  If you experience itching with your medications, try taking only a single pain pill, or even half a pain pill at a time.  You can also use Benadryl over the counter for itching or also to help with sleep.   TED HOSE STOCKINGS:  Use stockings on both legs until for at least 2 weeks or as directed by physician office. They may be removed at night for sleeping.  MEDICATIONS:  See your medication summary on the After Visit Summary that nursing will review with you.  You may have some home medications which will be placed on hold until you  complete the course of blood thinner medication.  It is important for you to complete the blood thinner medication as prescribed.  PRECAUTIONS:  If you experience chest pain or shortness of breath - call 911 immediately for transfer to the hospital emergency department.   If you develop a fever greater that 101 F, purulent drainage from wound, increased redness or drainage from wound, foul odor from the wound/dressing, or calf pain - CONTACT YOUR SURGEON.                                                   FOLLOW-UP APPOINTMENTS:  If you do not already have a post-op appointment, please call the office for an appointment to be seen by your  Psychologist, sport and exercise.  Guidelines for how soon to be seen are listed in your After Visit Summary, but are typically between 1-4 weeks after surgery.  OTHER INSTRUCTIONS:   Knee Replacement:  Do not place pillow under knee, focus on keeping the knee straight while resting. CPM instructions: 0-90 degrees, 2 hours in the morning, 2 hours in the afternoon, and 2 hours in the evening. Place foam block, curve side up under heel at all times except when in CPM or when walking.  DO NOT modify, tear, cut, or change the foam block in any way.  MAKE SURE YOU:   Understand these instructions.   Get help right away if you are not doing well or get worse.    Thank you for letting us be a part of your medical care team.  It is a privilege we respect greatly.  We hope these instructions will help you stay on track for a fast and full recovery!

## 2019-02-17 NOTE — TOC Initial Note (Signed)
Transition of Care [facility]) - Initial/Assessment Note    Patient Details  Name: Jamie Rollins MRN: [id number] Date of Birth: 1941/06/16  [facility] CM/SW Contact:    Gelene Mink, [place] Phone Number: 02/17/2019, 2:27 PM  Clinical Narrative:                 CSW met with the patient at bedside. The patient was eating her lunch.  Present in the room were her husband and sister. CSW obtained to permission to speak with them in the room. CSW introduced herself and explained her role. CSW shared the therapy recommendation. The patient was apprehensive about going to rehab due to not being able to have visitors. The patient's sister stated that she felt the patient would not do well in rehab and not have family around.   CSW obtained permission to call and coordinate home health. The patient stated that she needed a 3-in-1 and a walker. The patient's PCP is Dr. Manuella Ghazi at [facility]. The patient plans on following up with her PCP as scheduled.   CSW called Georgina Snell with [facility]. They are able to take the patient. The patient will need orders put. CSW called RN to ask for orders for home PT/OT/Nurse Aid. CSW called Zack with [employer] and ordered her 3-in-1 and walker. The equipment will be delivered to the room on Saturday.   CSW will continue to follow.    Expected Discharge Plan: [place] Barriers to Discharge: Continued Medical Work up   Patient Goals and CMS Choice Patient states their goals for this hospitalization and ongoing recovery are:: Pt would like to go home to complete rehab CMS Medicare.gov Compare Post Acute Care list provided to:: Patient Choice offered to / list presented to : Patient  Expected Discharge Plan and Services Expected Discharge Plan: [place] In-house Referral: Clinical Social Work Discharge Planning Services: NA Post Acute Care Choice: [place] arrangements for the past 2 months: [place] Expected Discharge Date: 02/18/19               DME Arranged: Gilford Rile, 3-N-1 DME Agency: [employer] Date DME Agency Contacted: 02/17/19 Time DME Agency Contacted: [phone] Representative spoke with at DME Agency: Bernie Arranged: OT, PT, Nurse's Aide [place] Agency: [place] Date [place]: 02/17/19 Time [place]: 70 Representative spoke with at [place]: Georgina Snell  Prior Living Arrangements/Services Living arrangements for the past 2 months: [place] with:: Spouse, Pets Patient language and need for interpreter reviewed:: No Do you feel safe going back to the place where you live?: Yes      Need for Family Participation in Patient Care: Yes (Comment) Care giver support system in place?: Yes (comment)   Criminal Activity/Legal Involvement Pertinent to Current Situation/Hospitalization: No - Comment as needed  Activities of Daily Living Home Assistive Devices/Equipment: Eyeglasses, Dentures (specify type) ADL Screening (condition at time of admission) Patient's cognitive ability adequate to safely complete daily activities?: Yes Is the patient deaf or have difficulty hearing?: No Does the patient have difficulty seeing, even when wearing glasses/contacts?: No Does the patient have difficulty concentrating, remembering, or making decisions?: No Patient able to express need for assistance with ADLs?: Yes Does the patient have difficulty dressing or bathing?: No Independently performs ADLs?: Yes (appropriate for developmental age) Does the patient have difficulty walking or climbing stairs?: Yes Weakness of Legs: None Weakness of Arms/Hands:  None  Permission Sought/Granted Permission sought to share information with : Case Manager Permission granted to share information with : Yes, Verbal Permission Granted  Share Information with NAME: Coralyn Mark & Thayer Headings  Permission granted to share info w AGENCY: [place] granted to share info w Relationship: Spouse and Sister     Emotional Assessment Appearance:: Appears stated age Attitude/Demeanor/Rapport: Engaged Affect (typically observed): Anxious Orientation: : Oriented to Self, Oriented to Place, Oriented to  Time, Oriented to Situation Alcohol / Substance Use: Not Applicable Psych Involvement: No (comment)  Admission diagnosis:  right hip osteoarthritis Patient Active Problem List   Diagnosis Date Noted  . Unilateral primary osteoarthritis, right hip 02/15/2019  . Arthritis of right hip 02/15/2019  . Bilateral primary osteoarthritis of hip 01/22/2019  . COPD GOLD ?  12/13/2018  . Essential hypertension 12/13/2018   PCP:  Monico Blitz, MD Pharmacy:   [place], [place] [address] [place] [address] Phone: [phone] Fax: [phone]     Social Determinants of Health (SDOH) Interventions    Readmission Risk Interventions No flowsheet data found.

## 2019-02-17 NOTE — Care Management Important Message (Signed)
Important Message  Patient Details  Name: Jamie Rollins MRN: [id number] Date of Birth: Sep 21, 1940   Medicare Important Message Given:  Yes     Lucas Winograd 02/17/2019, 4:00 PM

## 2019-02-17 NOTE — Progress Notes (Signed)
Physical Therapy Treatment Patient Details Name: Jamie Rollins MRN: [id number] DOB: [birth year] Today's Date: 02/17/2019    History of Present Illness Pt is a 78 y.o. female s/p R THA.    PT Comments    Pt progressing well with mobility. Stair training completed this session with pt requiring min guard assist ascend/descend 5 steps with 2 rails. She ambulated 180 feet with RW min guard assist. Pt returned to supine in bed at end of session. Tentative plan for d/c home tomorrow per MD progress note.    Follow Up Recommendations  Supervision/Assistance - 24 hour;Home health PT     Equipment Recommendations  None recommended by PT    Recommendations for Other Services       Precautions / Restrictions Precautions Precautions: Fall Restrictions Weight Bearing Restrictions: Yes RLE Weight Bearing: Weight bearing as tolerated    Mobility  Bed Mobility Overal bed mobility: Needs Assistance Bed Mobility: Sit to Supine       Sit to supine: Min assist   General bed mobility comments: assist with RLE into bed, increased time, cues for sequencing  Transfers Overall transfer level: Needs assistance Equipment used: Rolling walker (2 wheeled) Transfers: Sit to/from Stand Sit to Stand: Min guard         General transfer comment: cues for sequencing  Ambulation/Gait Ambulation/Gait assistance: Min guard Gait Distance (Feet): 180 Feet Assistive device: Rolling walker (2 wheeled) Gait Pattern/deviations: Step-through pattern;Decreased stride length;Decreased stance time - right Gait velocity: decreased Gait velocity interpretation: 1.31 - 2.62 ft/sec, indicative of limited community ambulator General Gait Details: steady gait with RW   Stairs Stairs: Yes Stairs assistance: Min guard Stair Management: Two rails;Step to pattern;Forwards Number of Stairs: 5 General stair comments: cues for sequencing   Wheelchair Mobility    Modified Rankin (Stroke Patients Only)        Balance Overall balance assessment: Needs assistance Sitting-balance support: No upper extremity supported;Feet supported Sitting balance-Leahy Scale: Good     Standing balance support: Bilateral upper extremity supported;During functional activity Standing balance-Leahy Scale: Poor Standing balance comment: reliant on RW                            Cognition Arousal/Alertness: Awake/alert Behavior During Therapy: WFL for tasks assessed/performed Overall Cognitive Status: Within Functional Limits for tasks assessed                                        Exercises Total Joint Exercises Ankle Circles/Pumps: AROM;Both;10 reps Quad Sets: AROM;Both;10 reps Heel Slides: AAROM;Right;10 reps Hip ABduction/ADduction: AAROM;Right;10 reps    General Comments        Pertinent Vitals/Pain Pain Assessment: 0-10 Pain Score: 7  Faces Pain Scale: Hurts even more Pain Location: R hip Pain Descriptors / Indicators: Grimacing;Guarding Pain Intervention(s): Monitored during session;Repositioned    Home Living Family/patient expects to be discharged to:: Private residence Living Arrangements: Spouse/significant other                  Prior Function            PT Goals (current goals can now be found in the care plan section) Acute Rehab PT Goals Patient Stated Goal: home PT Goal Formulation: With patient/family Time For Goal Achievement: 02/23/19 Potential to Achieve Goals: Good Progress towards PT goals: Progressing toward goals    Frequency  7X/week      PT Plan Current plan remains appropriate    Co-evaluation              AM-PAC PT "6 Clicks" Mobility   Outcome Measure  Help needed turning from your back to your side while in a flat bed without using bedrails?: A Little Help needed moving from lying on your back to sitting on the side of a flat bed without using bedrails?: A Little Help needed moving to and from a  bed to a chair (including a wheelchair)?: A Little Help needed standing up from a chair using your arms (e.g., wheelchair or bedside chair)?: A Little Help needed to walk in hospital room?: A Little Help needed climbing 3-5 steps with a railing? : A Little 6 Click Score: 18    End of Session Equipment Utilized During Treatment: Gait belt Activity Tolerance: Patient tolerated treatment well Patient left: in bed;with bed alarm set;with call bell/phone within reach Nurse Communication: Mobility status PT Visit Diagnosis: Other abnormalities of gait and mobility (R26.89);Pain Pain - Right/Left: Right Pain - part of body: Hip     Time: [id number] PT Time Calculation (min) (ACUTE ONLY): 13 min  Charges:  $Gait Training: 8-22 mins $Therapeutic Exercise: 8-22 mins                     Aida RaiderWendy Eleanora Guinyard, PT  Office # [phone] Pager [phone]    Ilda FoilGarrow, Jacqui Headen Rene 02/17/2019, 11:42 AM

## 2019-02-18 DIAGNOSIS — D62 Acute posthemorrhagic anemia: Secondary | ICD-10-CM | POA: Diagnosis not present

## 2019-02-18 LAB — CBC
HCT: 25.5 % — ABNORMAL LOW (ref 36.0–46.0)
Hemoglobin: 8.6 g/dL — ABNORMAL LOW (ref 12.0–15.0)
MCH: 31.5 pg (ref 26.0–34.0)
MCHC: 33.7 g/dL (ref 30.0–36.0)
MCV: 93.4 fL (ref 80.0–100.0)
Platelets: 113 10*3/uL — ABNORMAL LOW (ref 150–400)
RBC: 2.73 MIL/uL — ABNORMAL LOW (ref 3.87–5.11)
RDW: 15.1 % (ref 11.5–15.5)
WBC: 7.2 10*3/uL (ref 4.0–10.5)
nRBC: 0 % (ref 0.0–0.2)

## 2019-02-18 LAB — CBC WITH DIFFERENTIAL/PLATELET
Abs Immature Granulocytes: 0.03 10*3/uL (ref 0.00–0.07)
Basophils Absolute: 0 10*3/uL (ref 0.0–0.1)
Basophils Relative: 0 %
Eosinophils Absolute: 0.1 10*3/uL (ref 0.0–0.5)
Eosinophils Relative: 1 %
HCT: 26.1 % — ABNORMAL LOW (ref 36.0–46.0)
Hemoglobin: 8.9 g/dL — ABNORMAL LOW (ref 12.0–15.0)
Immature Granulocytes: 0 %
Lymphocytes Relative: 11 %
Lymphs Abs: 0.9 10*3/uL (ref 0.7–4.0)
MCH: 31.9 pg (ref 26.0–34.0)
MCHC: 34.1 g/dL (ref 30.0–36.0)
MCV: 93.5 fL (ref 80.0–100.0)
Monocytes Absolute: 0.6 10*3/uL (ref 0.1–1.0)
Monocytes Relative: 7 %
Neutro Abs: 6.2 10*3/uL (ref 1.7–7.7)
Neutrophils Relative %: 81 %
Platelets: 125 10*3/uL — ABNORMAL LOW (ref 150–400)
RBC: 2.79 MIL/uL — ABNORMAL LOW (ref 3.87–5.11)
RDW: 14.9 % (ref 11.5–15.5)
WBC: 7.7 10*3/uL (ref 4.0–10.5)
nRBC: 0 % (ref 0.0–0.2)

## 2019-02-18 MED ORDER — FERROUS GLUCONATE 324 (38 FE) MG PO TABS
324.0000 mg | ORAL_TABLET | Freq: Three times a day (TID) | ORAL | Status: DC
  Administered 2019-02-18: 324 mg via ORAL
  Filled 2019-02-18 (×2): qty 1

## 2019-02-18 MED ORDER — DOCUSATE SODIUM 100 MG PO CAPS: 100.0000 mg | ORAL_CAPSULE | Freq: Two times a day (BID) | ORAL | 1 refills | Status: DC

## 2019-02-18 MED ORDER — DOCUSATE SODIUM 100 MG PO CAPS: 100.0000 mg | ORAL_CAPSULE | Freq: Two times a day (BID) | ORAL | Status: DC

## 2019-02-18 MED ORDER — FERROUS GLUCONATE 324 (38 FE) MG PO TABS: 324.0000 mg | ORAL_TABLET | Freq: Three times a day (TID) | ORAL | 0 refills | Status: DC

## 2019-02-18 NOTE — Progress Notes (Addendum)
     Subjective: 3 Days Post-Op Procedure(s) (LRB): RIGHT TOTAL HIP ARTHROPLASTY DIRECT ANTERIOR (Right) When am I going home, this IV in my left hand hurts. I'm not sure what pain meds they are giving me. Discharge planning indicated they need face to face for [facility] for PT, OT and a nursing aide. Normally this is done post op orders.   Patient reports pain as moderate.    Objective:   VITALS:  Temp:  [98.6 F (37 C)-99.1 F (37.3 C)] 98.6 F (37 C) (08/08 0820) Pulse Rate:  [94-105] 98 (08/08 0843) Resp:  [13-16] 16 (08/08 0843) BP: (131-149)/(57-67) 147/58 (08/08 0820) SpO2:  [96 %-99 %] 99 % (08/08 0843) Weight:  [74.4 kg] 74.4 kg (08/07 1038)  Neurologically intact ABD soft Neurovascular intact Sensation intact distally Intact pulses distally Dorsiflexion/Plantar flexion intact Incision: scant drainage AGCell dressing applied right anterior hip incision.  Face to face HHN orders placed for PT and Nursing.  LABS Recent Labs    02/15/19 2119 02/16/19 0530 02/16/19 1705 02/17/19 0323 02/18/19 0350  HGB 8.0* 7.6* 9.8* 9.1* 8.6*  WBC  --  4.6  --  8.1 7.2  PLT  --  114*  --  116* 113*   Recent Labs    02/16/19 0530 02/17/19 1041  NA 137 134*  K 4.5 4.1  CL 106 103  CO2 21* 23  BUN 14 14  CREATININE 1.14* 1.01*  GLUCOSE 144* 123*   No results for input(s): LABPT, INR in the last 72 hours.   Assessment/Plan: 3 Days Post-Op Procedure(s) (LRB): RIGHT TOTAL HIP ARTHROPLASTY DIRECT ANTERIOR (Right)  Anemia, slightly worsened in past 24 hours to Hgb 8.6 from 9.1. Will recheck to determine if it is stabilized this late morning. Start ferrous gluconate for anemia due to periop blood loss which is normal.   Advance diet Up with therapy Discharge home with home health Start ferrous gluconate, check H/H today and orthostatic VS. Will see in early afternoon and reassess for possible discharge today.  Basil Dess 02/18/2019, 10:08 AMPatient ID: Jamie Rollins,  female   DOB: [birth year], 78 y.o.   MRN: [id number]

## 2019-02-18 NOTE — Plan of Care (Signed)

## 2019-02-18 NOTE — Progress Notes (Signed)
Discharge instructions/education addressed; Pt is in stable condition; not in respiratory distress; Husband & sister picked up the pt at [employer] entrance.

## 2019-02-18 NOTE — Progress Notes (Signed)
Patient ID: Jamie Rollins, female   DOB: 16-Jan-1941, 78 y.o.   MRN: [id number] [place] Hgb is 8.9 stable, and she can be discharged home with [facility] follow up for PT, OT AND nursing assistance.

## 2019-02-18 NOTE — Progress Notes (Signed)
Physical Therapy Progress Note  Clinical Impression: Pt seen for a second session prior to d/c home with family support. Focus of session was on gait training and pt education. PT discussed car transfers and generalized walking program for pt to initiate upon d/c home. Pt would continue to benefit from skilled physical therapy services at this time while admitted and after d/c to address the below listed limitations in order to improve overall safety and independence with functional mobility.  Sherie Don, [place], DPT  Acute Rehabilitation Services Pager [phone] Office [phone]    02/18/19 1155  PT Visit Information  Last PT Received On 02/18/19  Assistance Needed +1  History of Present Illness Pt is a 78 y.o. female s/p R THA, direct anterior. PMH including but not limited to COPD and HTN.  Precautions  Precautions Fall  Restrictions  Weight Bearing Restrictions Yes  RLE Weight Bearing WBAT  Pain Assessment  Pain Assessment Faces  Faces Pain Scale 2  Pain Location R hip  Pain Descriptors / Indicators Guarding  Pain Intervention(s) Monitored during session;Repositioned  Cognition  Arousal/Alertness Awake/alert  Behavior During Therapy WFL for tasks assessed/performed  Overall Cognitive Status Within Functional Limits for tasks assessed  Bed Mobility  General bed mobility comments pt OOB in recliner chair upon arrival  Transfers  Overall transfer level Needs assistance  Equipment used Rolling walker (2 wheeled)  Transfers Sit to/from Stand  Sit to Stand Supervision  General transfer comment supervision for safety  Ambulation/Gait  Ambulation/Gait assistance Min guard  Gait Distance (Feet) 200 Feet  Assistive device Rolling walker (2 wheeled)  Gait Pattern/deviations Step-through pattern;Decreased stride length;Decreased stance time - right;Decreased weight shift to right  General Gait Details pt steady with use of RW, no LOB or need for physical assistance, min guard  for safety  Gait velocity decreased  Balance  Overall balance assessment Needs assistance  Sitting-balance support No upper extremity supported;Feet supported  Sitting balance-Leahy Scale Good  Standing balance support Bilateral upper extremity supported;During functional activity  Standing balance-Leahy Scale Poor  PT - End of Session  Equipment Utilized During Treatment Gait belt  Activity Tolerance Patient tolerated treatment well  Patient left in chair;with call bell/phone within reach;with family/visitor present  Nurse Communication Mobility status   PT - Assessment/Plan  PT Plan Current plan remains appropriate  PT Visit Diagnosis Other abnormalities of gait and mobility (R26.89);Pain  Pain - Right/Left Right  Pain - part of body Hip  PT Frequency (ACUTE ONLY) 7X/week  Follow Up Recommendations Supervision/Assistance - 24 hour;Home health PT  PT equipment Rolling walker with 5" wheels;3in1 (PT)  AM-PAC PT "6 Clicks" Mobility Outcome Measure (Version 2)  Help needed turning from your back to your side while in a flat bed without using bedrails? 3  Help needed moving from lying on your back to sitting on the side of a flat bed without using bedrails? 3  Help needed moving to and from a bed to a chair (including a wheelchair)? 3  Help needed standing up from a chair using your arms (e.g., wheelchair or bedside chair)? 3  Help needed to walk in hospital room? 3  Help needed climbing 3-5 steps with a railing?  3  6 Click Score 18  Consider Recommendation of Discharge To: [facility] with [facility]  PT Goal Progression  Progress towards PT goals Progressing toward goals  Acute Rehab PT Goals  PT Goal Formulation With patient/family  Time For Goal Achievement 02/23/19  Potential to Achieve Goals Good  PT Time Calculation  PT Start Time (ACUTE ONLY) 1058  PT Stop Time (ACUTE ONLY) 1111  PT Time Calculation (min) (ACUTE ONLY) 13 min  PT General Charges  $$ ACUTE PT VISIT 1 Visit  PT  Treatments  $Gait Training 8-22 mins

## 2019-02-18 NOTE — Progress Notes (Signed)
Physical Therapy Treatment Patient Details Name: Jamie KitchensSharon M Weigand MRN: [id number] DOB: 07/17/1940 Today's Date: 02/18/2019    History of Present Illness Pt is a 78 y.o. female s/p R THA, direct anterior. PMH including but not limited to COPD and HTN.    PT Comments    Focus of this session was on LE strengthening therex as pt declining ambulation this session secondary to pain and fatigue. Pt would continue to benefit from skilled physical therapy services at this time while admitted and after d/c to address the below listed limitations in order to improve overall safety and independence with functional mobility. Plan is to d/c home to day with family.    Follow Up Recommendations  Supervision/Assistance - 24 hour;Home health PT     Equipment Recommendations  Rolling walker with 5" wheels;3in1 (PT)    Recommendations for Other Services       Precautions / Restrictions Precautions Precautions: Fall Restrictions Weight Bearing Restrictions: Yes RLE Weight Bearing: Weight bearing as tolerated    Mobility  Bed Mobility Overal bed mobility: Needs Assistance Bed Mobility: Supine to Sit;Sit to Supine     Supine to sit: Min assist Sit to supine: Min assist   General bed mobility comments: min assist to manage R LE, increased time, cues for technique  Transfers Overall transfer level: Needs assistance Equipment used: Rolling walker (2 wheeled) Transfers: Sit to/from Stand Sit to Stand: Supervision         General transfer comment: supervision for safety  Ambulation/Gait             General Gait Details: pt deferred gait until second session secondary to pain and being sleepy   Stairs             Wheelchair Mobility    Modified Rankin (Stroke Patients Only)       Balance Overall balance assessment: Needs assistance Sitting-balance support: No upper extremity supported;Feet supported Sitting balance-Leahy Scale: Good     Standing balance support:  Bilateral upper extremity supported;During functional activity Standing balance-Leahy Scale: Poor                              Cognition Arousal/Alertness: Awake/alert Behavior During Therapy: WFL for tasks assessed/performed Overall Cognitive Status: Within Functional Limits for tasks assessed                                        Exercises Total Joint Exercises Ankle Circles/Pumps: AROM;Both;20 reps;Supine Quad Sets: AROM;Strengthening;Right;10 reps;Supine Gluteal Sets: AROM;Strengthening;Both;10 reps;Supine Heel Slides: AAROM;Right;10 reps;Supine Hip ABduction/ADduction: AAROM;Right;10 reps;Supine Straight Leg Raises: AAROM;Right;10 reps;Supine    General Comments        Pertinent Vitals/Pain Pain Assessment: Faces Faces Pain Scale: Hurts whole lot Pain Location: R hip Pain Descriptors / Indicators: Grimacing;Guarding Pain Intervention(s): Monitored during session;Repositioned    Home Living                      Prior Function            PT Goals (current goals can now be found in the care plan section) Acute Rehab PT Goals PT Goal Formulation: With patient/family Time For Goal Achievement: 02/23/19 Potential to Achieve Goals: Good Progress towards PT goals: Progressing toward goals    Frequency    7X/week      PT Plan Current plan  remains appropriate    Co-evaluation              AM-PAC PT "6 Clicks" Mobility   Outcome Measure  Help needed turning from your back to your side while in a flat bed without using bedrails?: A Little Help needed moving from lying on your back to sitting on the side of a flat bed without using bedrails?: A Little Help needed moving to and from a bed to a chair (including a wheelchair)?: A Little Help needed standing up from a chair using your arms (e.g., wheelchair or bedside chair)?: A Little Help needed to walk in hospital room?: A Little Help needed climbing 3-5 steps with a  railing? : A Little 6 Click Score: 18    End of Session   Activity Tolerance: Patient limited by pain Patient left: in bed;with call bell/phone within reach;with bed alarm set Nurse Communication: Mobility status PT Visit Diagnosis: Other abnormalities of gait and mobility (R26.89);Pain Pain - Right/Left: Right Pain - part of body: Hip     Time: 9983-3825 PT Time Calculation (min) (ACUTE ONLY): 13 min  Charges:  $Therapeutic Exercise: 8-22 mins                     Sherie Don, PT, DPT  Acute Rehabilitation Services Pager [phone] Office [place] 02/18/2019, 11:53 AM

## 2019-02-20 NOTE — Progress Notes (Signed)
Received message that [facility] cannot provide [facility] for the patient due to staffing in the area. Per Vida Roller RN CM, from [place] stated that [facility] ( [facility] will accept the patient). Mindi Slicker [facility]  [facility] Supervisor

## 2019-02-20 NOTE — Progress Notes (Signed)
CSW received a phone call from [place] with [facility]. They are not able to give the patient home health services. At discharge the MD added a late [facility] order. They do not have an available HHRN to assist.   CSW contacted Butch Penny with [facility]. CSW contacted Tiffany with [facility]. CSW contacted Cassie with [facility], they are not able to assist. CSW contacted Dorian Pod with [facility], they are not able to assist.   CSW called and spoke with [facility] supervisor, Hassan Rowan. Hassan Rowan stated that she would follow up with Georgina Snell with [facility] to assist with finding a home health agency that would be able to accept the patient.   Domenic Schwab, MSW, [place] Worker [facility]  [phone]

## 2019-02-21 ENCOUNTER — Ambulatory Visit (INDEPENDENT_AMBULATORY_CARE_PROVIDER_SITE_OTHER): Payer: Medicare Other | Admitting: Orthopaedic Surgery

## 2019-02-21 DIAGNOSIS — Z96641 Presence of right artificial hip joint: Secondary | ICD-10-CM

## 2019-02-21 NOTE — Progress Notes (Signed)
   Post-Op Visit Note   Patient: Jamie Rollins           Date of Birth: [birth year]           MRN: [id number] Visit Date: 02/21/2019 PCP: Monico Blitz, MD   Assessment & Plan: Post total hip arthroplasty.  She is closed with staples and has had some serous drainage at the proximal end of the incision.  She has some edema in the lower extremities normally wears TED hose.  Creatinine had mild elevation preoperatively.  She received 2 units of packed cells due to some bleeding posteriorly in the capsule.  Chief Complaint:  Chief Complaint  Patient presents with  . Right Hip - Wound Check   Visit Diagnoses:  1. History of right hip hemiarthroplasty     Plan: Patient is having some serous drainage from her hip incision.  She will slow down some do dressing changes twice a day keep it clean.  There is no purulence her temperature is 97.2 and there is no cellulitis.  Return in 9 days as scheduled.  Follow-Up Instructions: No follow-ups on file.   Orders:  No orders of the defined types were placed in this encounter.  No orders of the defined types were placed in this encounter.   Imaging: No results found.  PMFS History: Patient Active Problem List   Diagnosis Date Noted  . History of right hip hemiarthroplasty 02/21/2019  . Acute blood loss as cause of postoperative anemia 02/18/2019    Class: Acute  . COPD GOLD ?  12/13/2018  . Essential hypertension 12/13/2018   Past Medical History:  Diagnosis Date  . Anemia   . Arthritis   . COPD (chronic obstructive pulmonary disease) ([place])   . Hypertension     No family history on file.  Past Surgical History:  Procedure Laterality Date  . CHOLECYSTECTOMY    . TOTAL HIP ARTHROPLASTY Right 02/15/2019  . TOTAL HIP ARTHROPLASTY Right 02/15/2019   Procedure: RIGHT TOTAL HIP ARTHROPLASTY DIRECT ANTERIOR;  Surgeon: [name], MD;  Location: [facility];  Service: Orthopedics;  Laterality: Right;   Social History   Occupational History   . Not on file  Tobacco Use  . Smoking status: Former Smoker    Types: Cigarettes    Quit date: 12/12/2013    Years since quitting: 5.1  . Smokeless tobacco: Never Used  Substance and Sexual Activity  . Alcohol use: Never    Frequency: Never  . Drug use: Not on file  . Sexual activity: Not on file

## 2019-02-22 ENCOUNTER — Telehealth: Payer: Self-pay | Admitting: Orthopaedic Surgery

## 2019-02-22 NOTE — Discharge Summary (Signed)
Patient ID: Jamie KitchensSharon M Reposa MRN: [id number] DOB/AGE: 76/01/1941 76 y.o.  Admit date: 02/15/2019 Discharge date: 02/22/2019  Admission Diagnoses:  Active Problems:   Acute blood loss as cause of postoperative anemia   Discharge Diagnoses:  Active Problems:   Acute blood loss as cause of postoperative anemia  status post Procedure(s): RIGHT TOTAL HIP ARTHROPLASTY DIRECT ANTERIOR  Past Medical History:  Diagnosis Date  . Anemia   . Arthritis   . COPD (chronic obstructive pulmonary disease) (HCC)   . Hypertension     Surgeries: Procedure(s): RIGHT TOTAL HIP ARTHROPLASTY DIRECT ANTERIOR on 02/15/2019   Consultants:   Discharged Condition: Improved  Hospital Course: Jamie KitchensSharon M Surowiec is an 78 y.o. female who was admitted 02/15/2019 for operative treatment of right hip arthritis.  Patient failed conservative treatments (please see the history and physical for the specifics) and had severe unremitting pain that affects sleep, daily activities and work/hobbies. After pre-op clearance, the patient was taken to the operating room on 02/15/2019 and underwent  Procedure(s): RIGHT TOTAL HIP ARTHROPLASTY DIRECT ANTERIOR.    Patient was given perioperative antibiotics:  Anti-infectives (From admission, onward)   Start     Dose/Rate Route Frequency Ordered Stop   02/15/19 2100  ceFAZolin (ANCEF) IVPB 1 g/50 mL premix     1 g 100 mL/hr over 30 Minutes Intravenous Every 8 hours 02/15/19 1801 02/16/19 0701   02/15/19 1215  ceFAZolin (ANCEF) IVPB 2g/100 mL premix     2 g 200 mL/hr over 30 Minutes Intravenous On call to O.R. 02/15/19 1037 02/15/19 1325   02/15/19 1041  ceFAZolin (ANCEF) 2-4 GM/100ML-% IVPB    Note to Pharmacy: Domingo DimesKotian, Sapna   : cabinet override      02/15/19 1041 02/15/19 1255       Patient was given sequential compression devices and early ambulation to prevent DVT.   Patient benefited maximally from hospital stay and there were no complications. At the time of discharge, the  patient was urinating/moving their bowels without difficulty, tolerating a regular diet, pain is controlled with oral pain medications and they have been cleared by PT/OT.   Recent vital signs: No data found.   Recent laboratory studies: No results for input(s): WBC, HGB, HCT, PLT, NA, K, CL, CO2, BUN, CREATININE, GLUCOSE, INR, CALCIUM in the last 72 hours.  Invalid input(s): PT, 2   Discharge Medications:   Allergies as of 02/18/2019      Reactions   Codeine Nausea And Vomiting      Medication List    STOP taking these medications   HYDROcodone-acetaminophen 5-325 MG tablet Commonly known as: NORCO/VICODIN Replaced by: HYDROcodone-acetaminophen 10-325 MG tablet     TAKE these medications   albuterol 108 (90 Base) MCG/ACT inhaler Commonly known as: VENTOLIN HFA Inhale 2 puffs into the lungs every 6 (six) hours as needed for wheezing or shortness of breath.   aspirin EC 325 MG tablet Take 1 tablet (325 mg total) by mouth daily. What changed:   medication strength  how much to take   atorvastatin 20 MG tablet Commonly known as: LIPITOR Take 20 mg by mouth daily.   budesonide-formoterol 80-4.5 MCG/ACT inhaler Commonly known as: Symbicort Inhale 2 puffs into the lungs 2 (two) times a day.   docusate sodium 100 MG capsule Commonly known as: COLACE Take 1 capsule (100 mg total) by mouth 2 (two) times daily.   ferrous gluconate 324 MG tablet Commonly known as: FERGON Take 1 tablet (324 mg total) by mouth  3 (three) times daily with meals.   Fish Oil 1000 MG Caps Take 2,000 mg by mouth daily.   gabapentin 100 MG capsule Commonly known as: NEURONTIN Take 100 mg by mouth daily.   HYDROcodone-acetaminophen 10-325 MG tablet Commonly known as: Norco Take 1 tablet by mouth every 6 (six) hours as needed. Replaces: HYDROcodone-acetaminophen 5-325 MG tablet   metoprolol succinate 25 MG 24 hr tablet Commonly known as: TOPROL-XL Take 25 mg by mouth daily.   MULTIPLE  VITAMIN PO Take 1 tablet by mouth daily.   omeprazole 40 MG capsule Commonly known as: PRILOSEC Take 40 mg by mouth daily.   telmisartan 80 MG tablet Commonly known as: Micardis Take 1 tablet (80 mg total) by mouth daily.       Diagnostic Studies: Dg C-arm 1-60 Min  Result Date: 02/15/2019 CLINICAL DATA:  Hip arthroplasty EXAM: OPERATIVE right HIP (WITH PELVIS IF PERFORMED) 4 VIEWS TECHNIQUE: Fluoroscopic spot image(s) were submitted for interpretation post-operatively. COMPARISON:  03/17/2018 FINDINGS: Four low resolution intraoperative spot views of the right hip. Total fluoroscopy time was 29 seconds. The images demonstrate a right hip replacement with normal alignment IMPRESSION: Intraoperative fluoroscopic assistance provided during right hip replacement. Electronically Signed   By: Jasmine PangKim  Fujinaga M.D.   On: 02/15/2019 15:13   Dg Hip Port Unilat With Pelvis 1v Right  Result Date: 02/15/2019 CLINICAL DATA:  Anterior approach right hip replacement EXAM: DG HIP (WITH OR WITHOUT PELVIS) 1V PORT RIGHT COMPARISON:  Intraoperative fluoroscopy same day FINDINGS: Patient is post total right hip with appropriate positioning of the acetabular cup and femoral articular components. Mild degenerative changes are present in the left hip. Osseous structures are otherwise unremarkable. Expected postsurgical soft tissue changes including skin staples are noted in the right hip. IMPRESSION: Interval total right hip arthroplasty without evidence of acute postoperative complication. Electronically Signed   By: Kreg ShropshirePrice  DeHay M.D.   On: 02/15/2019 19:27   Dg Hip Operative Unilat W Or W/o Pelvis Right  Result Date: 02/15/2019 CLINICAL DATA:  Hip arthroplasty EXAM: OPERATIVE right HIP (WITH PELVIS IF PERFORMED) 4 VIEWS TECHNIQUE: Fluoroscopic spot image(s) were submitted for interpretation post-operatively. COMPARISON:  03/17/2018 FINDINGS: Four low resolution intraoperative spot views of the right hip. Total  fluoroscopy time was 29 seconds. The images demonstrate a right hip replacement with normal alignment IMPRESSION: Intraoperative fluoroscopic assistance provided during right hip replacement. Electronically Signed   By: Jasmine PangKim  Fujinaga M.D.   On: 02/15/2019 15:13    Discharge Instructions    Call MD / Call 911   Complete by: As directed    If you experience chest pain or shortness of breath, CALL 911 and be transported to the hospital emergency room.  If you develope a fever above 101 F, pus (white drainage) or increased drainage or redness at the wound, or calf pain, call your surgeon's office.   Constipation Prevention   Complete by: As directed    Drink plenty of fluids.  Prune juice may be helpful.  You may use a stool softener, such as Colace (over the counter) 100 mg twice a day.  Use MiraLax (over the counter) for constipation as needed.   Diet - low sodium heart healthy   Complete by: As directed    Discharge instructions   Complete by: As directed    INSTRUCTIONS AFTER RIGHT TOTAL HIP JOINT REPLACEMENT   Remove items at home which could result in a fall. This includes throw rugs or furniture in walking pathways ICE to  the affected joint every three hours while awake for 30 minutes at a time, for at least the first 3-5 days, and then as needed for pain and swelling.  Continue to use ice for pain and swelling. You may notice swelling that will progress down to the foot and ankle.  This is normal after surgery.  Elevate your leg when you are not up walking on it.   Continue to use the breathing machine you got in the hospital (incentive spirometer) which will help keep your temperature down.  It is common for your temperature to cycle up and down following surgery, especially at night when you are not up moving around and exerting yourself.  The breathing machine keeps your lungs expanded and your temperature down.   DIET:  As you were doing prior to hospitalization, we recommend a  well-balanced diet.  DRESSING / WOUND CARE / SHOWERING  You may change your dressing 3-5 days after surgery.  Then change the dressing every day with sterile gauze.  Please use good hand washing techniques before changing the dressing.  Do not use any lotions or creams on the incision until instructed by your surgeon. NO TUB SOAKING.   ACTIVITY  Increase activity slowly as tolerated, but follow the weight bearing instructions below.   No driving for 6 weeks or until further direction given by your physician.  You cannot drive while taking narcotics.  No lifting or carrying greater than 10 lbs. until further directed by your surgeon. Avoid periods of inactivity such as sitting longer than an hour when not asleep. This helps prevent blood clots.  You may return to work once you are authorized by your doctor.     WEIGHT BEARING   Weight bearing as tolerated with assist device (walker, cane, etc) as directed, use it as long as suggested by your surgeon or therapist, typically at least 4-6 weeks.   EXERCISES  Results after joint replacement surgery are often greatly improved when you follow the exercise, range of motion and muscle strengthening exercises prescribed by your doctor. Safety measures are also important to protect the joint from further injury. Any time any of these exercises cause you to have increased pain or swelling, decrease what you are doing until you are comfortable again and then slowly increase them. If you have problems or questions, call your caregiver or physical therapist for advice.   Rehabilitation is important following a joint replacement. After just a few days of immobilization, the muscles of the leg can become weakened and shrink (atrophy).  These exercises are designed to build up the tone and strength of the thigh and leg muscles and to improve motion. Often times heat used for twenty to thirty minutes before working out will loosen up your tissues and help with  improving the range of motion but do not use heat for the first two weeks following surgery (sometimes heat can increase post-operative swelling).   These exercises can be done on a training (exercise) mat, on the floor, on a table or on a bed. Use whatever works the best and is most comfortable for you.    Use music or television while you are exercising so that the exercises are a pleasant break in your day. This will make your life better with the exercises acting as a break in your routine that you can look forward to.   Perform all exercises about fifteen times, three times per day or as directed.  You should exercise both the  operative leg and the other leg as well.  A rehabilitation program following joint replacement surgery can speed recovery and prevent re-injury in the future due to weakened muscles. Contact your doctor or a physical therapist for more information on knee rehabilitation.    CONSTIPATION  Constipation is defined medically as fewer than three stools per week and severe constipation as less than one stool per week.  Even if you have a regular bowel pattern at home, your normal regimen is likely to be disrupted due to multiple reasons following surgery.  Combination of anesthesia, postoperative narcotics, change in appetite and fluid intake all can affect your bowels.   YOU MUST use at least one of the following options; they are listed in order of increasing strength to get the job done.  They are all available over the counter, and you may need to use some, POSSIBLY even all of these options:    Drink plenty of fluids (prune juice may be helpful) and high fiber foods Colace 100 mg by mouth twice a day  Senokot for constipation as directed and as needed Dulcolax (bisacodyl), take with full glass of water  Miralax (polyethylene glycol) once or twice a day as needed.  If you have tried all these things and are unable to have a bowel movement in the first 3-4 days after  surgery call either your surgeon or your primary doctor.    If you experience loose stools or diarrhea, hold the medications until you stool forms back up.  If your symptoms do not get better within 1 week or if they get worse, check with your doctor.  If you experience "the worst abdominal pain ever" or develop nausea or vomiting, please contact the office immediately for further recommendations for treatment.   ITCHING:  If you experience itching with your medications, try taking only a single pain pill, or even half a pain pill at a time.  You can also use Benadryl over the counter for itching or also to help with sleep.   TED HOSE STOCKINGS:  Use stockings on both legs until for at least 2 weeks or as directed by physician office. They may be removed at night for sleeping.  MEDICATIONS:  See your medication summary on the "After Visit Summary" that nursing will review with you.  You may have some home medications which will be placed on hold until you complete the course of blood thinner medication.  It is important for you to complete the blood thinner medication as prescribed.  PRECAUTIONS:  If you experience chest pain or shortness of breath - call 911 immediately for transfer to the hospital emergency department.   If you develop a fever greater that 101 F, purulent drainage from wound, increased redness or drainage from wound, foul odor from the wound/dressing, or calf pain - CONTACT YOUR SURGEON.                                                   FOLLOW-UP APPOINTMENTS:  If you do not already have a post-op appointment, please call the office for an appointment to be seen by your surgeon.  Guidelines for how soon to be seen are listed in your "After Visit Summary", but are typically between 1-4 weeks after surgery.  OTHER INSTRUCTIONS:   Knee Replacement:  Do not place pillow under knee,  focus on keeping the knee straight while resting. CPM instructions: 0-90 degrees, 2 hours in the  morning, 2 hours in the afternoon, and 2 hours in the evening. Place foam block, curve side up under heel at all times except when in CPM or when walking.  DO NOT modify, tear, cut, or change the foam block in any way.  MAKE SURE YOU:  Understand these instructions.  Get help right away if you are not doing well or get worse.    Thank you for letting us be a part of your medical care team.  It is a privilege we respect greatly.  We hope these instructions will help you stay on track for a fast and full recovery!   Driving restrictions   Complete by: As directed    No driving for 6 weeks   Increase activity slowly as tolerated   Complete by: As directed    Lifting restrictions   Complete by: As directed    No lifting for 6 weeks      Follow-up Information    Eldred Manges, MD. Schedule an appointment as soon as possible for a visit today.   Specialty: Orthopedic Surgery Why: needs return office visit 2 weeks postop Contact information: [address] [place] [address] [phone]           Discharge Plan:  discharge to home  Disposition:     Signed: Zonia Kief  02/22/2019, 9:01 AM

## 2019-02-22 NOTE — Telephone Encounter (Signed)
[facility]ie Rollins (PT) with [facility] called needing verbal orders for HHPT 2 Wk 1 and 3 Wk 2. The number to contact Jamie Rollins is [phone]

## 2019-02-22 NOTE — Telephone Encounter (Signed)
Please advise 

## 2019-02-22 NOTE — Telephone Encounter (Signed)
Hold on PT , upper incision has some serous drainage and we are decreasing activity until it seals off so she does not get an infection.  U call thanks

## 2019-02-23 NOTE — Telephone Encounter (Signed)
I called Amy and advised. 

## 2019-03-02 ENCOUNTER — Other Ambulatory Visit: Payer: Self-pay

## 2019-03-02 ENCOUNTER — Ambulatory Visit (INDEPENDENT_AMBULATORY_CARE_PROVIDER_SITE_OTHER): Payer: Medicare Other | Admitting: Orthopaedic Surgery

## 2019-03-02 VITALS — BP 135/75 | HR 89 | Ht 64.0 in | Wt 163.0 lb

## 2019-03-02 DIAGNOSIS — Z96641 Presence of right artificial hip joint: Secondary | ICD-10-CM

## 2019-03-03 ENCOUNTER — Inpatient Hospital Stay: Payer: Medicare Other | Admitting: Orthopaedic Surgery

## 2019-03-03 ENCOUNTER — Encounter: Payer: Self-pay | Admitting: Orthopaedic Surgery

## 2019-03-03 ENCOUNTER — Ambulatory Visit (INDEPENDENT_AMBULATORY_CARE_PROVIDER_SITE_OTHER): Payer: Medicare Other

## 2019-03-03 DIAGNOSIS — Z96641 Presence of right artificial hip joint: Secondary | ICD-10-CM

## 2019-03-03 NOTE — Progress Notes (Signed)
   Post-Op Visit Note   Patient: Jamie Rollins           Date of Birth: 10-19-1940           MRN: [id number] Visit Date: 03/02/2019 PCP: Monico Blitz, MD   Assessment & Plan: Post total hip arthroplasty, right.  Proximal portion of her incision had some serous drainage this is healed nicely.  She is amatory has good relief her preop pain.  Chief Complaint:  Chief Complaint  Patient presents with  . Right Hip - Routine Post Op    02/15/2019 Right THA   Visit Diagnoses:  1. History of right hip hemiarthroplasty     Plan: Area of the proximal incision that was slow to heal is now completely healed over.  No drainage no cellulitis. We discussed gradually increasing her walking distance for strength and stamina. Follow-Up Instructions: No follow-ups on file.   Orders:  Orders Placed This Encounter  Procedures  . XR HIP UNILAT W OR W/O PELVIS 2-3 VIEWS RIGHT   No orders of the defined types were placed in this encounter.   Imaging: No results found.  PMFS History: Patient Active Problem List   Diagnosis Date Noted  . History of right hip hemiarthroplasty 02/21/2019  . Acute blood loss as cause of postoperative anemia 02/18/2019    Class: Acute  . COPD GOLD ?  12/13/2018  . Essential hypertension 12/13/2018   Past Medical History:  Diagnosis Date  . Anemia   . Arthritis   . COPD (chronic obstructive pulmonary disease) ([place])   . Hypertension     No family history on file.  Past Surgical History:  Procedure Laterality Date  . CHOLECYSTECTOMY    . TOTAL HIP ARTHROPLASTY Right 02/15/2019  . TOTAL HIP ARTHROPLASTY Right 02/15/2019   Procedure: RIGHT TOTAL HIP ARTHROPLASTY DIRECT ANTERIOR;  Surgeon: [name], MD;  Location: [facility];  Service: Orthopedics;  Laterality: Right;   Social History   Occupational History  . Not on file  Tobacco Use  . Smoking status: Former Smoker    Types: Cigarettes    Quit date: 12/12/2013    Years since quitting: 5.2  . Smokeless  tobacco: Never Used  Substance and Sexual Activity  . Alcohol use: Never    Frequency: Never  . Drug use: Not on file  . Sexual activity: Not on file

## 2019-03-10 ENCOUNTER — Telehealth: Payer: Self-pay | Admitting: Orthopaedic Surgery

## 2019-03-10 NOTE — Telephone Encounter (Signed)
Please advise 

## 2019-03-10 NOTE — Telephone Encounter (Signed)
Cancel PT . thanks

## 2019-03-10 NOTE — Telephone Encounter (Signed)
Amy/[facility] called to see if [facility] was ready to proceed w/PT with patient.  Please call Amy @ [phone]  Amy states can leave vm on restricted line with information

## 2019-03-10 NOTE — Telephone Encounter (Signed)
I called Amy and advised. 

## 2019-03-30 ENCOUNTER — Encounter: Payer: Self-pay | Admitting: Orthopaedic Surgery

## 2019-03-30 ENCOUNTER — Ambulatory Visit (INDEPENDENT_AMBULATORY_CARE_PROVIDER_SITE_OTHER): Payer: Medicare Other | Admitting: Orthopaedic Surgery

## 2019-03-30 DIAGNOSIS — Z96641 Presence of right artificial hip joint: Secondary | ICD-10-CM

## 2019-03-30 NOTE — Progress Notes (Signed)
   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  Chief Complaint:  Chief Complaint  Patient presents with  . Right Hip - Pain    02/15/2019 total hip right   . Right Knee - Pain   Visit Diagnoses:  1. Hx of total hip arthroplasty, right     Plan: Post total hip arthroplasty with continued pain anteriorly that radiates down to her knee she has been taking hydrocodone during the day makes her sleepy and then at night she not able to rest comfortably.  She has some aching in her thigh.  She is not able to go up on a single step without significant hop and has quad weakness with manual testing but can do straight leg raise so she is at it at least 50% quad strength but likely not more than 75%.  Opposite quad strong negative logroll to the opposite left hip.  Station foot is intact leg lengths are equal.  Patient needs a physical therapy for quad strengthening to improve her gait.  Return 2 months for recheck.  I gave her additional exercises to do at home.  Follow-Up Instructions: Return in about 2 months (around 05/30/2019).   Orders:  No orders of the defined types were placed in this encounter.  No orders of the defined types were placed in this encounter.   Imaging: No results found.  PMFS History: Patient Active Problem List   Diagnosis Date Noted  . Hx of total hip arthroplasty, right 03/30/2019  . Acute blood loss as cause of postoperative anemia 02/18/2019    Class: Acute  . COPD GOLD ?  12/13/2018  . Essential hypertension 12/13/2018   Past Medical History:  Diagnosis Date  . Anemia   . Arthritis   . COPD (chronic obstructive pulmonary disease) ([facility])   . Hypertension     History reviewed. No pertinent family history.  Past Surgical History:  Procedure Laterality Date  .  CHOLECYSTECTOMY    . TOTAL HIP ARTHROPLASTY Right 02/15/2019  . TOTAL HIP ARTHROPLASTY Right 02/15/2019   Procedure: RIGHT TOTAL HIP ARTHROPLASTY DIRECT ANTERIOR;  Surgeon: [name], MD;  Location: [facility];  Service: Orthopedics;  Laterality: Right;   Social History   Occupational History  . Not on file  Tobacco Use  . Smoking status: Former Smoker    Types: Cigarettes    Quit date: 12/12/2013    Years since quitting: 5.2  . Smokeless tobacco: Never Used  Substance and Sexual Activity  . Alcohol use: Never    Frequency: Never  . Drug use: Not on file  . Sexual activity: Not on file

## 2019-06-01 ENCOUNTER — Encounter: Payer: Self-pay | Admitting: Orthopaedic Surgery

## 2019-06-01 ENCOUNTER — Ambulatory Visit (INDEPENDENT_AMBULATORY_CARE_PROVIDER_SITE_OTHER): Payer: Medicare Other | Admitting: Orthopaedic Surgery

## 2019-06-01 VITALS — Ht 64.0 in | Wt 156.8 lb

## 2019-06-01 DIAGNOSIS — Z96641 Presence of right artificial hip joint: Secondary | ICD-10-CM | POA: Diagnosis not present

## 2019-06-01 NOTE — Progress Notes (Signed)
Office Visit Note   Patient: Jamie Rollins           Date of Birth: 1941/07/07           MRN: [id number] Visit Date: 06/01/2019              Requested by: Monico Blitz, MD [address] [place],  [place] [address] PCP: Monico Blitz, MD   Assessment & Plan: Visit Diagnoses:  1. Hx of total hip arthroplasty, right     Plan: Continue post right total hip arthroplasty.  She is happy with the surgical result she needs to continue doing a little bit of quad strengthening.  Follow-Up Instructions: No follow-ups on file.   Orders:  No orders of the defined types were placed in this encounter.  No orders of the defined types were placed in this encounter.     Procedures: No procedures performed   Clinical Data: No additional findings.   Subjective: Chief Complaint  Patient presents with  . Right Hip - Follow-up    02/15/2019 Right THA Direct Anterior    HPI slightly over 3 months postop hip arthroplasty.  She is walking better gets tired if she is on her feet a lot by lunchtime she is ready to resume Silver sneakers and can drive.  Leg lengths are equal no pain hip incisions well-healed.  She is no longer limping.  Review of Systems updated unchanged.   Objective: Vital Signs: Ht 5\' 4"  (1.626 m)   Wt 156 lb 12.8 oz (71.1 kg)   BMI 26.91 kg/m   Physical Exam Constitutional:      Appearance: She is well-developed.  HENT:     Head: Normocephalic.     Right Ear: External ear normal.     Left Ear: External ear normal.  Eyes:     Pupils: Pupils are equal, round, and reactive to light.  Neck:     Thyroid: No thyromegaly.     Trachea: No tracheal deviation.  Cardiovascular:     Rate and Rhythm: Normal rate.  Pulmonary:     Effort: Pulmonary effort is normal.  Abdominal:     Palpations: Abdomen is soft.  Skin:    General: Skin is warm and dry.  Neurological:     Mental Status: She is alert and oriented to person, place, and time.  Psychiatric:        Behavior:  Behavior normal.     Ortho Exam hip incisions well-healed leg lengths are equal negative Trendelenburg gait.  She still has slight quad weakness and needs to continue working on right quad strengthening.  Opposite hip has good range of motion no pain.  Specialty Comments:  No specialty comments available.  Imaging: No results found.   PMFS History: Patient Active Problem List   Diagnosis Date Noted  . Hx of total hip arthroplasty, right 03/30/2019  . Acute blood loss as cause of postoperative anemia 02/18/2019    Class: Acute  . COPD GOLD ?  12/13/2018  . Essential hypertension 12/13/2018   Past Medical History:  Diagnosis Date  . Anemia   . Arthritis   . COPD (chronic obstructive pulmonary disease) ([facility])   . Hypertension     No family history on file.  Past Surgical History:  Procedure Laterality Date  . CHOLECYSTECTOMY    . TOTAL HIP ARTHROPLASTY Right 02/15/2019  . TOTAL HIP ARTHROPLASTY Right 02/15/2019   Procedure: RIGHT TOTAL HIP ARTHROPLASTY DIRECT ANTERIOR;  Surgeon: [name], MD;  Location[facility];  Service: Orthopedics;  Laterality: Right;   Social History   Occupational History  . Not on file  Tobacco Use  . Smoking status: Former Smoker    Types: Cigarettes    Quit date: 12/12/2013    Years since quitting: 5.4  . Smokeless tobacco: Never Used  Substance and Sexual Activity  . Alcohol use: Never    Frequency: Never  . Drug use: Not on file  . Sexual activity: Not on file

## 2019-07-13 ENCOUNTER — Ambulatory Visit: Payer: Medicare Other | Admitting: Orthopaedic Surgery

## 2019-07-20 DIAGNOSIS — N183 Chronic kidney disease, stage 3 unspecified: Secondary | ICD-10-CM | POA: Diagnosis not present

## 2019-07-20 DIAGNOSIS — J449 Chronic obstructive pulmonary disease, unspecified: Secondary | ICD-10-CM | POA: Diagnosis not present

## 2019-07-20 DIAGNOSIS — Z87891 Personal history of nicotine dependence: Secondary | ICD-10-CM | POA: Diagnosis not present

## 2019-07-20 DIAGNOSIS — I10 Essential (primary) hypertension: Secondary | ICD-10-CM | POA: Diagnosis not present

## 2019-07-20 DIAGNOSIS — Z299 Encounter for prophylactic measures, unspecified: Secondary | ICD-10-CM | POA: Diagnosis not present

## 2019-07-24 DIAGNOSIS — I10 Essential (primary) hypertension: Secondary | ICD-10-CM | POA: Diagnosis not present

## 2019-07-24 DIAGNOSIS — M159 Polyosteoarthritis, unspecified: Secondary | ICD-10-CM | POA: Diagnosis not present

## 2019-07-24 DIAGNOSIS — J449 Chronic obstructive pulmonary disease, unspecified: Secondary | ICD-10-CM | POA: Diagnosis not present

## 2019-09-12 DIAGNOSIS — J449 Chronic obstructive pulmonary disease, unspecified: Secondary | ICD-10-CM | POA: Diagnosis not present

## 2019-09-12 DIAGNOSIS — I10 Essential (primary) hypertension: Secondary | ICD-10-CM | POA: Diagnosis not present

## 2019-09-12 DIAGNOSIS — M159 Polyosteoarthritis, unspecified: Secondary | ICD-10-CM | POA: Diagnosis not present

## 2019-11-01 DIAGNOSIS — I10 Essential (primary) hypertension: Secondary | ICD-10-CM | POA: Diagnosis not present

## 2019-11-01 DIAGNOSIS — M159 Polyosteoarthritis, unspecified: Secondary | ICD-10-CM | POA: Diagnosis not present

## 2019-11-01 DIAGNOSIS — J449 Chronic obstructive pulmonary disease, unspecified: Secondary | ICD-10-CM | POA: Diagnosis not present

## 2019-12-10 DIAGNOSIS — M159 Polyosteoarthritis, unspecified: Secondary | ICD-10-CM | POA: Diagnosis not present

## 2019-12-10 DIAGNOSIS — J449 Chronic obstructive pulmonary disease, unspecified: Secondary | ICD-10-CM | POA: Diagnosis not present

## 2019-12-10 DIAGNOSIS — I10 Essential (primary) hypertension: Secondary | ICD-10-CM | POA: Diagnosis not present

## 2020-01-04 DIAGNOSIS — J449 Chronic obstructive pulmonary disease, unspecified: Secondary | ICD-10-CM | POA: Diagnosis not present

## 2020-01-04 DIAGNOSIS — Z299 Encounter for prophylactic measures, unspecified: Secondary | ICD-10-CM | POA: Diagnosis not present

## 2020-01-04 DIAGNOSIS — M79674 Pain in right toe(s): Secondary | ICD-10-CM | POA: Diagnosis not present

## 2020-01-04 DIAGNOSIS — I10 Essential (primary) hypertension: Secondary | ICD-10-CM | POA: Diagnosis not present

## 2020-01-10 DIAGNOSIS — J449 Chronic obstructive pulmonary disease, unspecified: Secondary | ICD-10-CM | POA: Diagnosis not present

## 2020-01-10 DIAGNOSIS — I10 Essential (primary) hypertension: Secondary | ICD-10-CM | POA: Diagnosis not present

## 2020-01-10 DIAGNOSIS — M159 Polyosteoarthritis, unspecified: Secondary | ICD-10-CM | POA: Diagnosis not present

## 2020-01-17 DIAGNOSIS — I10 Essential (primary) hypertension: Secondary | ICD-10-CM | POA: Diagnosis not present

## 2020-01-17 DIAGNOSIS — J449 Chronic obstructive pulmonary disease, unspecified: Secondary | ICD-10-CM | POA: Diagnosis not present

## 2020-01-17 DIAGNOSIS — Z299 Encounter for prophylactic measures, unspecified: Secondary | ICD-10-CM | POA: Diagnosis not present

## 2020-02-04 ENCOUNTER — Other Ambulatory Visit: Payer: Self-pay | Admitting: Internal Medicine

## 2020-02-04 DIAGNOSIS — J449 Chronic obstructive pulmonary disease, unspecified: Secondary | ICD-10-CM

## 2020-02-09 DIAGNOSIS — I10 Essential (primary) hypertension: Secondary | ICD-10-CM | POA: Diagnosis not present

## 2020-02-09 DIAGNOSIS — J449 Chronic obstructive pulmonary disease, unspecified: Secondary | ICD-10-CM | POA: Diagnosis not present

## 2020-02-09 DIAGNOSIS — M159 Polyosteoarthritis, unspecified: Secondary | ICD-10-CM | POA: Diagnosis not present

## 2020-02-12 DIAGNOSIS — I10 Essential (primary) hypertension: Secondary | ICD-10-CM | POA: Diagnosis not present

## 2020-02-12 DIAGNOSIS — J449 Chronic obstructive pulmonary disease, unspecified: Secondary | ICD-10-CM | POA: Diagnosis not present

## 2020-02-12 DIAGNOSIS — M159 Polyosteoarthritis, unspecified: Secondary | ICD-10-CM | POA: Diagnosis not present

## 2020-02-21 DIAGNOSIS — Z7189 Other specified counseling: Secondary | ICD-10-CM | POA: Diagnosis not present

## 2020-02-21 DIAGNOSIS — Z299 Encounter for prophylactic measures, unspecified: Secondary | ICD-10-CM | POA: Diagnosis not present

## 2020-02-21 DIAGNOSIS — Z0000 Encounter for general adult medical examination without abnormal findings: Secondary | ICD-10-CM | POA: Diagnosis not present

## 2020-02-23 DIAGNOSIS — E559 Vitamin D deficiency, unspecified: Secondary | ICD-10-CM | POA: Diagnosis not present

## 2020-02-23 DIAGNOSIS — E7800 Pure hypercholesterolemia, unspecified: Secondary | ICD-10-CM | POA: Diagnosis not present

## 2020-02-23 DIAGNOSIS — R5383 Other fatigue: Secondary | ICD-10-CM | POA: Diagnosis not present

## 2020-02-23 DIAGNOSIS — Z79899 Other long term (current) drug therapy: Secondary | ICD-10-CM | POA: Diagnosis not present

## 2020-04-11 DIAGNOSIS — M159 Polyosteoarthritis, unspecified: Secondary | ICD-10-CM | POA: Diagnosis not present

## 2020-04-11 DIAGNOSIS — J449 Chronic obstructive pulmonary disease, unspecified: Secondary | ICD-10-CM | POA: Diagnosis not present

## 2020-04-11 DIAGNOSIS — I10 Essential (primary) hypertension: Secondary | ICD-10-CM | POA: Diagnosis not present

## 2020-04-19 DIAGNOSIS — J449 Chronic obstructive pulmonary disease, unspecified: Secondary | ICD-10-CM | POA: Diagnosis not present

## 2020-04-19 DIAGNOSIS — Z299 Encounter for prophylactic measures, unspecified: Secondary | ICD-10-CM | POA: Diagnosis not present

## 2020-04-19 DIAGNOSIS — S41119A Laceration without foreign body of unspecified upper arm, initial encounter: Secondary | ICD-10-CM | POA: Diagnosis not present

## 2020-04-19 DIAGNOSIS — I10 Essential (primary) hypertension: Secondary | ICD-10-CM | POA: Diagnosis not present

## 2020-05-10 DIAGNOSIS — J449 Chronic obstructive pulmonary disease, unspecified: Secondary | ICD-10-CM | POA: Diagnosis not present

## 2020-05-10 DIAGNOSIS — I10 Essential (primary) hypertension: Secondary | ICD-10-CM | POA: Diagnosis not present

## 2020-05-10 DIAGNOSIS — M159 Polyosteoarthritis, unspecified: Secondary | ICD-10-CM | POA: Diagnosis not present

## 2020-05-22 DIAGNOSIS — Z299 Encounter for prophylactic measures, unspecified: Secondary | ICD-10-CM | POA: Diagnosis not present

## 2020-05-22 DIAGNOSIS — M542 Cervicalgia: Secondary | ICD-10-CM | POA: Diagnosis not present

## 2020-05-22 DIAGNOSIS — R52 Pain, unspecified: Secondary | ICD-10-CM | POA: Diagnosis not present

## 2020-05-22 DIAGNOSIS — M25559 Pain in unspecified hip: Secondary | ICD-10-CM | POA: Diagnosis not present

## 2020-05-22 DIAGNOSIS — M25552 Pain in left hip: Secondary | ICD-10-CM | POA: Diagnosis not present

## 2020-05-22 DIAGNOSIS — I10 Essential (primary) hypertension: Secondary | ICD-10-CM | POA: Diagnosis not present

## 2020-05-22 DIAGNOSIS — S76919A Strain of unspecified muscles, fascia and tendons at thigh level, unspecified thigh, initial encounter: Secondary | ICD-10-CM | POA: Diagnosis not present

## 2020-05-24 ENCOUNTER — Other Ambulatory Visit: Payer: Self-pay | Admitting: Internal Medicine

## 2020-05-24 DIAGNOSIS — J449 Chronic obstructive pulmonary disease, unspecified: Secondary | ICD-10-CM

## 2020-06-11 DIAGNOSIS — M159 Polyosteoarthritis, unspecified: Secondary | ICD-10-CM | POA: Diagnosis not present

## 2020-06-11 DIAGNOSIS — J449 Chronic obstructive pulmonary disease, unspecified: Secondary | ICD-10-CM | POA: Diagnosis not present

## 2020-06-11 DIAGNOSIS — I10 Essential (primary) hypertension: Secondary | ICD-10-CM | POA: Diagnosis not present

## 2020-06-27 DIAGNOSIS — I10 Essential (primary) hypertension: Secondary | ICD-10-CM | POA: Diagnosis not present

## 2020-06-27 DIAGNOSIS — Z299 Encounter for prophylactic measures, unspecified: Secondary | ICD-10-CM | POA: Diagnosis not present

## 2020-06-27 DIAGNOSIS — R040 Epistaxis: Secondary | ICD-10-CM | POA: Diagnosis not present

## 2020-06-27 DIAGNOSIS — Z87891 Personal history of nicotine dependence: Secondary | ICD-10-CM | POA: Diagnosis not present

## 2020-07-11 DIAGNOSIS — M159 Polyosteoarthritis, unspecified: Secondary | ICD-10-CM | POA: Diagnosis not present

## 2020-07-11 DIAGNOSIS — J449 Chronic obstructive pulmonary disease, unspecified: Secondary | ICD-10-CM | POA: Diagnosis not present

## 2020-07-11 DIAGNOSIS — I10 Essential (primary) hypertension: Secondary | ICD-10-CM | POA: Diagnosis not present

## 2020-07-22 DIAGNOSIS — I10 Essential (primary) hypertension: Secondary | ICD-10-CM | POA: Diagnosis not present

## 2020-07-22 DIAGNOSIS — Z299 Encounter for prophylactic measures, unspecified: Secondary | ICD-10-CM | POA: Diagnosis not present

## 2020-07-22 DIAGNOSIS — J449 Chronic obstructive pulmonary disease, unspecified: Secondary | ICD-10-CM | POA: Diagnosis not present

## 2020-07-22 DIAGNOSIS — N183 Chronic kidney disease, stage 3 unspecified: Secondary | ICD-10-CM | POA: Diagnosis not present

## 2020-07-22 DIAGNOSIS — Z87891 Personal history of nicotine dependence: Secondary | ICD-10-CM | POA: Diagnosis not present

## 2020-07-22 DIAGNOSIS — R202 Paresthesia of skin: Secondary | ICD-10-CM | POA: Diagnosis not present

## 2020-09-09 DIAGNOSIS — K219 Gastro-esophageal reflux disease without esophagitis: Secondary | ICD-10-CM | POA: Diagnosis not present

## 2020-09-09 DIAGNOSIS — I10 Essential (primary) hypertension: Secondary | ICD-10-CM | POA: Diagnosis not present

## 2020-09-12 ENCOUNTER — Other Ambulatory Visit: Payer: Self-pay | Admitting: Internal Medicine

## 2020-09-12 DIAGNOSIS — J449 Chronic obstructive pulmonary disease, unspecified: Secondary | ICD-10-CM

## 2020-09-12 DIAGNOSIS — Z299 Encounter for prophylactic measures, unspecified: Secondary | ICD-10-CM | POA: Diagnosis not present

## 2020-09-12 DIAGNOSIS — Z87891 Personal history of nicotine dependence: Secondary | ICD-10-CM | POA: Diagnosis not present

## 2020-09-12 DIAGNOSIS — I10 Essential (primary) hypertension: Secondary | ICD-10-CM | POA: Diagnosis not present

## 2020-09-12 DIAGNOSIS — R202 Paresthesia of skin: Secondary | ICD-10-CM | POA: Diagnosis not present

## 2020-09-12 DIAGNOSIS — G5602 Carpal tunnel syndrome, left upper limb: Secondary | ICD-10-CM | POA: Diagnosis not present

## 2020-10-02 DIAGNOSIS — Z299 Encounter for prophylactic measures, unspecified: Secondary | ICD-10-CM | POA: Diagnosis not present

## 2020-10-02 DIAGNOSIS — N183 Chronic kidney disease, stage 3 unspecified: Secondary | ICD-10-CM | POA: Diagnosis not present

## 2020-10-02 DIAGNOSIS — J449 Chronic obstructive pulmonary disease, unspecified: Secondary | ICD-10-CM | POA: Diagnosis not present

## 2020-10-02 DIAGNOSIS — J069 Acute upper respiratory infection, unspecified: Secondary | ICD-10-CM | POA: Diagnosis not present

## 2020-10-02 DIAGNOSIS — I10 Essential (primary) hypertension: Secondary | ICD-10-CM | POA: Diagnosis not present

## 2020-10-18 DIAGNOSIS — Z299 Encounter for prophylactic measures, unspecified: Secondary | ICD-10-CM | POA: Diagnosis not present

## 2020-10-18 DIAGNOSIS — G5602 Carpal tunnel syndrome, left upper limb: Secondary | ICD-10-CM | POA: Diagnosis not present

## 2020-10-18 DIAGNOSIS — M25551 Pain in right hip: Secondary | ICD-10-CM | POA: Diagnosis not present

## 2020-10-18 DIAGNOSIS — I10 Essential (primary) hypertension: Secondary | ICD-10-CM | POA: Diagnosis not present

## 2020-10-18 DIAGNOSIS — M7061 Trochanteric bursitis, right hip: Secondary | ICD-10-CM | POA: Diagnosis not present

## 2020-10-18 DIAGNOSIS — Z87891 Personal history of nicotine dependence: Secondary | ICD-10-CM | POA: Diagnosis not present

## 2020-11-06 ENCOUNTER — Encounter: Payer: Self-pay | Admitting: Neurology

## 2020-11-09 DIAGNOSIS — I10 Essential (primary) hypertension: Secondary | ICD-10-CM | POA: Diagnosis not present

## 2020-11-09 DIAGNOSIS — K219 Gastro-esophageal reflux disease without esophagitis: Secondary | ICD-10-CM | POA: Diagnosis not present

## 2020-11-13 DIAGNOSIS — M19012 Primary osteoarthritis, left shoulder: Secondary | ICD-10-CM | POA: Diagnosis not present

## 2020-11-13 DIAGNOSIS — M9971 Connective tissue and disc stenosis of intervertebral foramina of cervical region: Secondary | ICD-10-CM | POA: Diagnosis not present

## 2020-11-13 DIAGNOSIS — M25559 Pain in unspecified hip: Secondary | ICD-10-CM | POA: Diagnosis not present

## 2020-11-13 DIAGNOSIS — S299XXA Unspecified injury of thorax, initial encounter: Secondary | ICD-10-CM | POA: Diagnosis not present

## 2020-11-13 DIAGNOSIS — M1612 Unilateral primary osteoarthritis, left hip: Secondary | ICD-10-CM | POA: Diagnosis not present

## 2020-11-13 DIAGNOSIS — M4802 Spinal stenosis, cervical region: Secondary | ICD-10-CM | POA: Diagnosis not present

## 2020-11-13 DIAGNOSIS — M79642 Pain in left hand: Secondary | ICD-10-CM | POA: Diagnosis not present

## 2020-11-13 DIAGNOSIS — S41111A Laceration without foreign body of right upper arm, initial encounter: Secondary | ICD-10-CM | POA: Diagnosis not present

## 2020-11-13 DIAGNOSIS — S5002XA Contusion of left elbow, initial encounter: Secondary | ICD-10-CM | POA: Diagnosis not present

## 2020-11-13 DIAGNOSIS — M25511 Pain in right shoulder: Secondary | ICD-10-CM | POA: Diagnosis not present

## 2020-11-13 DIAGNOSIS — I10 Essential (primary) hypertension: Secondary | ICD-10-CM | POA: Diagnosis not present

## 2020-11-13 DIAGNOSIS — M25512 Pain in left shoulder: Secondary | ICD-10-CM | POA: Diagnosis not present

## 2020-11-13 DIAGNOSIS — S50811A Abrasion of right forearm, initial encounter: Secondary | ICD-10-CM | POA: Diagnosis not present

## 2020-11-13 DIAGNOSIS — I700 Atherosclerosis of aorta: Secondary | ICD-10-CM | POA: Diagnosis not present

## 2020-11-13 DIAGNOSIS — S50812A Abrasion of left forearm, initial encounter: Secondary | ICD-10-CM | POA: Diagnosis not present

## 2020-11-13 DIAGNOSIS — I708 Atherosclerosis of other arteries: Secondary | ICD-10-CM | POA: Diagnosis not present

## 2020-11-13 DIAGNOSIS — I6523 Occlusion and stenosis of bilateral carotid arteries: Secondary | ICD-10-CM | POA: Diagnosis not present

## 2020-11-13 DIAGNOSIS — M47812 Spondylosis without myelopathy or radiculopathy, cervical region: Secondary | ICD-10-CM | POA: Diagnosis not present

## 2020-11-13 DIAGNOSIS — W1839XA Other fall on same level, initial encounter: Secondary | ICD-10-CM | POA: Diagnosis not present

## 2020-11-13 DIAGNOSIS — S8002XA Contusion of left knee, initial encounter: Secondary | ICD-10-CM | POA: Diagnosis not present

## 2020-11-13 DIAGNOSIS — I739 Peripheral vascular disease, unspecified: Secondary | ICD-10-CM | POA: Diagnosis not present

## 2020-11-13 DIAGNOSIS — G319 Degenerative disease of nervous system, unspecified: Secondary | ICD-10-CM | POA: Diagnosis not present

## 2020-11-13 DIAGNOSIS — J323 Chronic sphenoidal sinusitis: Secondary | ICD-10-CM | POA: Diagnosis not present

## 2020-11-13 DIAGNOSIS — Z885 Allergy status to narcotic agent status: Secondary | ICD-10-CM | POA: Diagnosis not present

## 2020-11-13 DIAGNOSIS — S0990XA Unspecified injury of head, initial encounter: Secondary | ICD-10-CM | POA: Diagnosis not present

## 2020-11-13 DIAGNOSIS — R918 Other nonspecific abnormal finding of lung field: Secondary | ICD-10-CM | POA: Diagnosis not present

## 2020-11-13 DIAGNOSIS — M19011 Primary osteoarthritis, right shoulder: Secondary | ICD-10-CM | POA: Diagnosis not present

## 2020-11-19 DIAGNOSIS — N183 Chronic kidney disease, stage 3 unspecified: Secondary | ICD-10-CM | POA: Diagnosis not present

## 2020-11-19 DIAGNOSIS — Z299 Encounter for prophylactic measures, unspecified: Secondary | ICD-10-CM | POA: Diagnosis not present

## 2020-11-19 DIAGNOSIS — I10 Essential (primary) hypertension: Secondary | ICD-10-CM | POA: Diagnosis not present

## 2020-11-19 DIAGNOSIS — J449 Chronic obstructive pulmonary disease, unspecified: Secondary | ICD-10-CM | POA: Diagnosis not present

## 2020-11-19 DIAGNOSIS — Z9181 History of falling: Secondary | ICD-10-CM | POA: Diagnosis not present

## 2020-12-03 DIAGNOSIS — Z9181 History of falling: Secondary | ICD-10-CM | POA: Diagnosis not present

## 2020-12-03 DIAGNOSIS — R2681 Unsteadiness on feet: Secondary | ICD-10-CM | POA: Diagnosis not present

## 2020-12-09 DIAGNOSIS — K219 Gastro-esophageal reflux disease without esophagitis: Secondary | ICD-10-CM | POA: Diagnosis not present

## 2020-12-09 DIAGNOSIS — I10 Essential (primary) hypertension: Secondary | ICD-10-CM | POA: Diagnosis not present

## 2020-12-12 DIAGNOSIS — R2681 Unsteadiness on feet: Secondary | ICD-10-CM | POA: Diagnosis not present

## 2020-12-12 DIAGNOSIS — Z9181 History of falling: Secondary | ICD-10-CM | POA: Diagnosis not present

## 2020-12-16 DIAGNOSIS — R2681 Unsteadiness on feet: Secondary | ICD-10-CM | POA: Diagnosis not present

## 2020-12-16 DIAGNOSIS — Z9181 History of falling: Secondary | ICD-10-CM | POA: Diagnosis not present

## 2020-12-18 DIAGNOSIS — R2681 Unsteadiness on feet: Secondary | ICD-10-CM | POA: Diagnosis not present

## 2020-12-18 DIAGNOSIS — Z299 Encounter for prophylactic measures, unspecified: Secondary | ICD-10-CM | POA: Diagnosis not present

## 2020-12-18 DIAGNOSIS — I10 Essential (primary) hypertension: Secondary | ICD-10-CM | POA: Diagnosis not present

## 2020-12-18 DIAGNOSIS — R202 Paresthesia of skin: Secondary | ICD-10-CM | POA: Diagnosis not present

## 2020-12-18 DIAGNOSIS — R200 Anesthesia of skin: Secondary | ICD-10-CM | POA: Diagnosis not present

## 2020-12-18 DIAGNOSIS — M25551 Pain in right hip: Secondary | ICD-10-CM | POA: Diagnosis not present

## 2020-12-18 DIAGNOSIS — Z9181 History of falling: Secondary | ICD-10-CM | POA: Diagnosis not present

## 2020-12-18 DIAGNOSIS — B353 Tinea pedis: Secondary | ICD-10-CM | POA: Diagnosis not present

## 2020-12-23 DIAGNOSIS — R2681 Unsteadiness on feet: Secondary | ICD-10-CM | POA: Diagnosis not present

## 2020-12-23 DIAGNOSIS — Z9181 History of falling: Secondary | ICD-10-CM | POA: Diagnosis not present

## 2020-12-25 DIAGNOSIS — R2681 Unsteadiness on feet: Secondary | ICD-10-CM | POA: Diagnosis not present

## 2020-12-25 DIAGNOSIS — Z9181 History of falling: Secondary | ICD-10-CM | POA: Diagnosis not present

## 2021-01-06 DIAGNOSIS — R2681 Unsteadiness on feet: Secondary | ICD-10-CM | POA: Diagnosis not present

## 2021-01-06 DIAGNOSIS — R29898 Other symptoms and signs involving the musculoskeletal system: Secondary | ICD-10-CM | POA: Diagnosis not present

## 2021-01-06 DIAGNOSIS — Z9181 History of falling: Secondary | ICD-10-CM | POA: Diagnosis not present

## 2021-01-08 DIAGNOSIS — R2681 Unsteadiness on feet: Secondary | ICD-10-CM | POA: Diagnosis not present

## 2021-01-08 DIAGNOSIS — Z9181 History of falling: Secondary | ICD-10-CM | POA: Diagnosis not present

## 2021-01-08 DIAGNOSIS — R29898 Other symptoms and signs involving the musculoskeletal system: Secondary | ICD-10-CM | POA: Diagnosis not present

## 2021-01-09 DIAGNOSIS — I10 Essential (primary) hypertension: Secondary | ICD-10-CM | POA: Diagnosis not present

## 2021-01-09 DIAGNOSIS — K219 Gastro-esophageal reflux disease without esophagitis: Secondary | ICD-10-CM | POA: Diagnosis not present

## 2021-01-16 DIAGNOSIS — Z9181 History of falling: Secondary | ICD-10-CM | POA: Diagnosis not present

## 2021-01-16 DIAGNOSIS — R29898 Other symptoms and signs involving the musculoskeletal system: Secondary | ICD-10-CM | POA: Diagnosis not present

## 2021-01-16 DIAGNOSIS — R2681 Unsteadiness on feet: Secondary | ICD-10-CM | POA: Diagnosis not present

## 2021-01-21 DIAGNOSIS — Z9181 History of falling: Secondary | ICD-10-CM | POA: Diagnosis not present

## 2021-01-21 DIAGNOSIS — R2681 Unsteadiness on feet: Secondary | ICD-10-CM | POA: Diagnosis not present

## 2021-01-21 DIAGNOSIS — R29898 Other symptoms and signs involving the musculoskeletal system: Secondary | ICD-10-CM | POA: Diagnosis not present

## 2021-01-23 DIAGNOSIS — R29898 Other symptoms and signs involving the musculoskeletal system: Secondary | ICD-10-CM | POA: Diagnosis not present

## 2021-01-23 DIAGNOSIS — Z9181 History of falling: Secondary | ICD-10-CM | POA: Diagnosis not present

## 2021-01-23 DIAGNOSIS — R2681 Unsteadiness on feet: Secondary | ICD-10-CM | POA: Diagnosis not present

## 2021-01-24 ENCOUNTER — Other Ambulatory Visit: Payer: Self-pay

## 2021-01-24 ENCOUNTER — Ambulatory Visit: Payer: Medicare Other | Admitting: Neurology

## 2021-01-24 ENCOUNTER — Encounter: Payer: Self-pay | Admitting: Neurology

## 2021-01-24 VITALS — BP 122/68 | HR 78 | Ht 64.0 in | Wt 164.0 lb

## 2021-01-24 DIAGNOSIS — R202 Paresthesia of skin: Secondary | ICD-10-CM | POA: Diagnosis not present

## 2021-01-24 MED ORDER — GABAPENTIN 300 MG PO CAPS: 300.0000 mg | ORAL_CAPSULE | Freq: Every day | ORAL | 3 refills | Status: AC

## 2021-01-24 NOTE — Progress Notes (Signed)
[facility] Neurology Division Clinic Note - Initial Visit   Date: 01/24/21  Jamie Rollins MRN: [id number] DOB: 1941/03/04   Dear Dr. Sherryll Burger:  Thank you for your kind referral of Jamie Rollins for consultation of hand tingling. Although her history is well known to you, please allow [place] to reiterate it for the purpose of our medical record. The patient was accompanied to the clinic by husband who also provides collateral information.     History of Present Illness: Jamie Rollins is a 80 y.o. right-handed female with COPD, hyperlipidemia, and hypertension presenting for evaluation of bilateral hand tingling. Starting early in 2021, she began having numbness/tingling/cold sensation of the left hand. She does have weakness in the hand, especially with holding objects and opening jars/bottles. Her right hand began to have similar symptoms in the right hand.  No neck pain. She takes gabapentin 100mg  twice daily.  She has tried wearing a brace and denies any change.,  She has been using for the past several months due to gait difficulty.  She has low back pain and some tingling in the toes.  No radicular pain in the legs. She is getting PT.   She used to work as a .    Past Medical History:  Diagnosis Date   Anemia    Arthritis    COPD (chronic obstructive pulmonary disease) (HCC)    Hypertension     Past Surgical History:  Procedure Laterality Date   CHOLECYSTECTOMY     TOTAL HIP ARTHROPLASTY Right 02/15/2019   TOTAL HIP ARTHROPLASTY Right 02/15/2019   Procedure: RIGHT TOTAL HIP ARTHROPLASTY DIRECT ANTERIOR;  Surgeon: 04/17/2019, MD;  Location: [facility] OR;  Service: Orthopedics;  Laterality: Right;     Medications:  Outpatient Encounter Medications as of 01/24/2021  Medication Sig   albuterol (VENTOLIN HFA) 108 (90 Base) MCG/ACT inhaler Inhale 2 puffs into the lungs every 6 (six) hours as needed for wheezing or shortness of breath.   atorvastatin (LIPITOR) 20 MG  tablet Take 20 mg by mouth daily.    docusate sodium (COLACE) 100 MG capsule Take 1 capsule (100 mg total) by mouth 2 (two) times daily.   gabapentin (NEURONTIN) 100 MG capsule Take 100 mg by mouth daily.    lisinopril (ZESTRIL) 20 MG tablet    metoprolol succinate (TOPROL-XL) 25 MG 24 hr tablet Take 25 mg by mouth daily.    MULTIPLE VITAMIN PO Take 1 tablet by mouth daily.    Omega-3 Fatty Acids (FISH OIL) 1000 MG CAPS Take 2,000 mg by mouth daily.    SYMBICORT 80-4.5 MCG/ACT inhaler inhale TWO puffs into THE lungs TWICE DAILY   [DISCONTINUED] ferrous gluconate (FERGON) 324 MG tablet Take 1 tablet (324 mg total) by mouth 3 (three) times daily with meals.   [DISCONTINUED] omeprazole (PRILOSEC) 40 MG capsule Take 40 mg by mouth daily.    telmisartan (MICARDIS) 80 MG tablet Take 1 tablet (80 mg total) by mouth daily. (Patient not taking: Reported on 01/24/2021)   [DISCONTINUED] aspirin EC 325 MG tablet Take 1 tablet (325 mg total) by mouth daily.   [DISCONTINUED] HYDROcodone-acetaminophen (NORCO) 10-325 MG tablet Take 1 tablet by mouth every 6 (six) hours as needed.   No facility-administered encounter medications on file as of 01/24/2021.    Allergies:  Allergies  Allergen Reactions   Codeine Nausea And Vomiting    Family History: History reviewed. No pertinent family history.  Social History: Social History   Tobacco Use  Smoking status: Former    Types: Cigarettes    Quit date: 12/12/2013    Years since quitting: 7.1   Smokeless tobacco: Never  Substance Use Topics   Alcohol use: Never   Drug use: Never   Social History   Social History Narrative   Living with husband   Right handed   Drink coffee 2-3 cup a day.    Vital Signs:  BP 122/68   Pulse 78   Ht 5\' 4"  (1.626 m)   Wt 164 lb (74.4 kg)   SpO2 98%   BMI 28.15 kg/m    Neurological Exam: MENTAL STATUS including orientation to time, place, person, recent and remote memory, attention span and concentration,  language, and fund of knowledge is normal.  Speech is not dysarthric.  CRANIAL NERVES: II:  No visual field defects.  III-IV-VI: Pupils equal round and reactive to light.  Normal conjugate, extra-ocular eye movements in all directions of gaze.  No nystagmus.  No ptosis.   V:  Normal facial sensation.    VII:  Normal facial symmetry and movements.   VIII:  Normal hearing and vestibular function.   IX-X:  Normal palatal movement.   XI:  Normal shoulder shrug and head rotation.   XII:  Normal tongue strength and range of motion, no deviation or fasciculation.  MOTOR:  No atrophy, fasciculations or abnormal movements.  No pronator drift.   Upper Extremity:  Right  Left  Deltoid  5/5   5/5   Biceps  5/5   5/5   Triceps  5/5   5/5   Infraspinatus 5/5  5/5  Medial pectoralis 5/5  5/5  Wrist extensors  5/5   5/5   Wrist flexors  5/5   5/5   Finger extensors  5/5   5/5   Finger flexors  5/5   5/5   Dorsal interossei  5/5   5/5   Abductor pollicis  5/5   5-/5   Tone (Ashworth scale)  0  0   Lower Extremity:  Right  Left  Hip flexors  5/5   5/5   Hip extensors  5/5   5/5   Adductor 5/5  5/5  Abductor 5/5  5/5  Knee flexors  5/5   5/5   Knee extensors  5/5   5/5   Dorsiflexors  5/5   5/5   Plantarflexors  5/5   5/5   Toe extensors  5/5   5/5   Toe flexors  5/5   5/5   Tone (Ashworth scale)  0  0   MSRs:  Right        Left                  brachioradialis 2+  2+  biceps 2+  2+  triceps 2+  2+  patellar 3+  3+  ankle jerk 2+  2+  Hoffman no  no  plantar response down  down   SENSORY:  Vibration reduced at the great toe bilaterally, intact in the hands.  Pin prick and temperature intact throughout.  Tinel's sign is negative at the wrists bilaterally.   COORDINATION/GAIT: Normal finger-to- nose-finger.  Intact rapid alternating movements bilaterally.  Gait is very slow, narrow, caution and she tends to lean forward looking at the feet when walking, unassisted.  She arrived in  wheelchair to visit today.    IMPRESSION: Bilateral hand paresthesia, worse on the left.  Ddx:  entrapment neuropathy vs cervical radiculopathy  -  No significant benefit with wearing wrist splint  - NCS/EMG of the arms  - Renally dose gabapentin 300mg  at bedtime  Further recommendations pending results.    Thank you for allowing me to participate in patient's care.  If I can answer any additional questions, I would be pleased to do so.    Sincerely,    Teeghan Hammer K. , DO

## 2021-01-24 NOTE — Patient Instructions (Addendum)
Start gabapentin 300mg  at bedtime   Nerve testing of the arms  ELECTROMYOGRAM AND NERVE CONDUCTION STUDIES (EMG/NCS) INSTRUCTIONS  How to Prepare The neurologist conducting the EMG will need to know if you have certain medical conditions. Tell the neurologist and other EMG lab personnel if you: Have a pacemaker or any other electrical medical device Take blood-thinning medications Have hemophilia, a blood-clotting disorder that causes prolonged bleeding Bathing Take a shower or bath shortly before your exam in order to remove oils from your skin. Don't apply lotions or creams before the exam.  What to Expect You'll likely be asked to change into a hospital gown for the procedure and lie down on an examination table. The following explanations can help you understand what will happen during the exam.  Electrodes. The neurologist or a technician places surface electrodes at various locations on your skin depending on where you're experiencing symptoms. Or the neurologist may insert needle electrodes at different sites depending on your symptoms.  Sensations. The electrodes will at times transmit a tiny electrical current that you may feel as a twinge or spasm. The needle electrode may cause discomfort or pain that usually ends shortly after the needle is removed. If you are concerned about discomfort or pain, you may want to talk to the neurologist about taking a short break during the exam.  Instructions. During the needle EMG, the neurologist will assess whether there is any spontaneous electrical activity when the muscle is at rest - activity that isn't present in healthy muscle tissue - and the degree of activity when you slightly contract the muscle.  He or she will give you instructions on resting and contracting a muscle at appropriate times. Depending on what muscles and nerves the neurologist is examining, he or she may ask you to change positions during the exam.  After your EMG You may  experience some temporary, minor bruising where the needle electrode was inserted into your muscle. This bruising should fade within several days. If it persists, contact your primary care doctor.

## 2021-01-28 DIAGNOSIS — I959 Hypotension, unspecified: Secondary | ICD-10-CM | POA: Diagnosis not present

## 2021-01-28 DIAGNOSIS — I313 Pericardial effusion (noninflammatory): Secondary | ICD-10-CM | POA: Diagnosis not present

## 2021-01-28 DIAGNOSIS — I700 Atherosclerosis of aorta: Secondary | ICD-10-CM | POA: Diagnosis not present

## 2021-01-28 DIAGNOSIS — K449 Diaphragmatic hernia without obstruction or gangrene: Secondary | ICD-10-CM | POA: Diagnosis not present

## 2021-01-28 DIAGNOSIS — K219 Gastro-esophageal reflux disease without esophagitis: Secondary | ICD-10-CM | POA: Diagnosis not present

## 2021-01-28 DIAGNOSIS — J439 Emphysema, unspecified: Secondary | ICD-10-CM | POA: Diagnosis not present

## 2021-01-28 DIAGNOSIS — I208 Other forms of angina pectoris: Secondary | ICD-10-CM | POA: Diagnosis not present

## 2021-01-28 DIAGNOSIS — E785 Hyperlipidemia, unspecified: Secondary | ICD-10-CM | POA: Diagnosis not present

## 2021-01-28 DIAGNOSIS — R0789 Other chest pain: Secondary | ICD-10-CM | POA: Diagnosis not present

## 2021-01-28 DIAGNOSIS — Z8719 Personal history of other diseases of the digestive system: Secondary | ICD-10-CM | POA: Diagnosis not present

## 2021-01-28 DIAGNOSIS — R0602 Shortness of breath: Secondary | ICD-10-CM | POA: Diagnosis not present

## 2021-01-28 DIAGNOSIS — Z20822 Contact with and (suspected) exposure to covid-19: Secondary | ICD-10-CM | POA: Diagnosis not present

## 2021-01-28 DIAGNOSIS — Z87891 Personal history of nicotine dependence: Secondary | ICD-10-CM | POA: Diagnosis not present

## 2021-01-28 DIAGNOSIS — R079 Chest pain, unspecified: Secondary | ICD-10-CM | POA: Diagnosis not present

## 2021-01-28 DIAGNOSIS — Z7982 Long term (current) use of aspirin: Secondary | ICD-10-CM | POA: Diagnosis not present

## 2021-01-28 DIAGNOSIS — Z79899 Other long term (current) drug therapy: Secondary | ICD-10-CM | POA: Diagnosis not present

## 2021-01-28 DIAGNOSIS — I10 Essential (primary) hypertension: Secondary | ICD-10-CM | POA: Diagnosis not present

## 2021-01-28 DIAGNOSIS — I25119 Atherosclerotic heart disease of native coronary artery with unspecified angina pectoris: Secondary | ICD-10-CM | POA: Diagnosis not present

## 2021-01-28 DIAGNOSIS — Z7951 Long term (current) use of inhaled steroids: Secondary | ICD-10-CM | POA: Diagnosis not present

## 2021-01-28 DIAGNOSIS — I071 Rheumatic tricuspid insufficiency: Secondary | ICD-10-CM | POA: Diagnosis not present

## 2021-01-28 DIAGNOSIS — Z885 Allergy status to narcotic agent status: Secondary | ICD-10-CM | POA: Diagnosis not present

## 2021-01-29 DIAGNOSIS — E785 Hyperlipidemia, unspecified: Secondary | ICD-10-CM | POA: Diagnosis not present

## 2021-01-29 DIAGNOSIS — R0789 Other chest pain: Secondary | ICD-10-CM | POA: Diagnosis not present

## 2021-01-29 DIAGNOSIS — I10 Essential (primary) hypertension: Secondary | ICD-10-CM | POA: Diagnosis not present

## 2021-01-29 DIAGNOSIS — I313 Pericardial effusion (noninflammatory): Secondary | ICD-10-CM | POA: Diagnosis not present

## 2021-01-29 DIAGNOSIS — I208 Other forms of angina pectoris: Secondary | ICD-10-CM | POA: Diagnosis not present

## 2021-01-29 DIAGNOSIS — I2510 Atherosclerotic heart disease of native coronary artery without angina pectoris: Secondary | ICD-10-CM | POA: Diagnosis not present

## 2021-01-29 DIAGNOSIS — Z8719 Personal history of other diseases of the digestive system: Secondary | ICD-10-CM | POA: Diagnosis not present

## 2021-02-04 DIAGNOSIS — Z9181 History of falling: Secondary | ICD-10-CM | POA: Diagnosis not present

## 2021-02-04 DIAGNOSIS — I10 Essential (primary) hypertension: Secondary | ICD-10-CM | POA: Diagnosis not present

## 2021-02-04 DIAGNOSIS — J449 Chronic obstructive pulmonary disease, unspecified: Secondary | ICD-10-CM | POA: Diagnosis not present

## 2021-02-04 DIAGNOSIS — R079 Chest pain, unspecified: Secondary | ICD-10-CM | POA: Diagnosis not present

## 2021-02-04 DIAGNOSIS — R52 Pain, unspecified: Secondary | ICD-10-CM | POA: Diagnosis not present

## 2021-02-04 DIAGNOSIS — R29898 Other symptoms and signs involving the musculoskeletal system: Secondary | ICD-10-CM | POA: Diagnosis not present

## 2021-02-04 DIAGNOSIS — R2681 Unsteadiness on feet: Secondary | ICD-10-CM | POA: Diagnosis not present

## 2021-02-04 DIAGNOSIS — Z299 Encounter for prophylactic measures, unspecified: Secondary | ICD-10-CM | POA: Diagnosis not present

## 2021-02-04 DIAGNOSIS — N183 Chronic kidney disease, stage 3 unspecified: Secondary | ICD-10-CM | POA: Diagnosis not present

## 2021-02-05 DIAGNOSIS — R52 Pain, unspecified: Secondary | ICD-10-CM | POA: Diagnosis not present

## 2021-02-05 DIAGNOSIS — R2681 Unsteadiness on feet: Secondary | ICD-10-CM | POA: Diagnosis not present

## 2021-02-05 DIAGNOSIS — R29898 Other symptoms and signs involving the musculoskeletal system: Secondary | ICD-10-CM | POA: Diagnosis not present

## 2021-02-05 DIAGNOSIS — Z9181 History of falling: Secondary | ICD-10-CM | POA: Diagnosis not present

## 2021-02-09 DIAGNOSIS — I10 Essential (primary) hypertension: Secondary | ICD-10-CM | POA: Diagnosis not present

## 2021-02-09 DIAGNOSIS — K219 Gastro-esophageal reflux disease without esophagitis: Secondary | ICD-10-CM | POA: Diagnosis not present

## 2021-02-11 DIAGNOSIS — R2681 Unsteadiness on feet: Secondary | ICD-10-CM | POA: Diagnosis not present

## 2021-02-11 DIAGNOSIS — Z9181 History of falling: Secondary | ICD-10-CM | POA: Diagnosis not present

## 2021-02-11 DIAGNOSIS — R52 Pain, unspecified: Secondary | ICD-10-CM | POA: Diagnosis not present

## 2021-02-11 DIAGNOSIS — R29898 Other symptoms and signs involving the musculoskeletal system: Secondary | ICD-10-CM | POA: Diagnosis not present

## 2021-02-13 DIAGNOSIS — Z9181 History of falling: Secondary | ICD-10-CM | POA: Diagnosis not present

## 2021-02-13 DIAGNOSIS — R2681 Unsteadiness on feet: Secondary | ICD-10-CM | POA: Diagnosis not present

## 2021-02-13 DIAGNOSIS — R52 Pain, unspecified: Secondary | ICD-10-CM | POA: Diagnosis not present

## 2021-02-13 DIAGNOSIS — R29898 Other symptoms and signs involving the musculoskeletal system: Secondary | ICD-10-CM | POA: Diagnosis not present

## 2021-02-17 DIAGNOSIS — Z789 Other specified health status: Secondary | ICD-10-CM | POA: Diagnosis not present

## 2021-02-17 DIAGNOSIS — Z299 Encounter for prophylactic measures, unspecified: Secondary | ICD-10-CM | POA: Diagnosis not present

## 2021-02-17 DIAGNOSIS — D509 Iron deficiency anemia, unspecified: Secondary | ICD-10-CM | POA: Diagnosis not present

## 2021-02-17 DIAGNOSIS — Z713 Dietary counseling and surveillance: Secondary | ICD-10-CM | POA: Diagnosis not present

## 2021-02-17 DIAGNOSIS — I10 Essential (primary) hypertension: Secondary | ICD-10-CM | POA: Diagnosis not present

## 2021-02-18 DIAGNOSIS — R29898 Other symptoms and signs involving the musculoskeletal system: Secondary | ICD-10-CM | POA: Diagnosis not present

## 2021-02-18 DIAGNOSIS — Z9181 History of falling: Secondary | ICD-10-CM | POA: Diagnosis not present

## 2021-02-18 DIAGNOSIS — R52 Pain, unspecified: Secondary | ICD-10-CM | POA: Diagnosis not present

## 2021-02-18 DIAGNOSIS — R2681 Unsteadiness on feet: Secondary | ICD-10-CM | POA: Diagnosis not present

## 2021-02-21 DIAGNOSIS — I209 Angina pectoris, unspecified: Secondary | ICD-10-CM | POA: Diagnosis not present

## 2021-02-21 DIAGNOSIS — I25118 Atherosclerotic heart disease of native coronary artery with other forms of angina pectoris: Secondary | ICD-10-CM | POA: Diagnosis not present

## 2021-02-25 DIAGNOSIS — R2681 Unsteadiness on feet: Secondary | ICD-10-CM | POA: Diagnosis not present

## 2021-02-25 DIAGNOSIS — Z9181 History of falling: Secondary | ICD-10-CM | POA: Diagnosis not present

## 2021-02-25 DIAGNOSIS — R52 Pain, unspecified: Secondary | ICD-10-CM | POA: Diagnosis not present

## 2021-02-25 DIAGNOSIS — R29898 Other symptoms and signs involving the musculoskeletal system: Secondary | ICD-10-CM | POA: Diagnosis not present

## 2021-03-10 DIAGNOSIS — I10 Essential (primary) hypertension: Secondary | ICD-10-CM | POA: Diagnosis not present

## 2021-03-10 DIAGNOSIS — E782 Mixed hyperlipidemia: Secondary | ICD-10-CM | POA: Diagnosis not present

## 2021-03-10 DIAGNOSIS — I25118 Atherosclerotic heart disease of native coronary artery with other forms of angina pectoris: Secondary | ICD-10-CM | POA: Diagnosis not present

## 2021-03-11 ENCOUNTER — Ambulatory Visit (INDEPENDENT_AMBULATORY_CARE_PROVIDER_SITE_OTHER): Payer: Medicare Other | Admitting: Neurology

## 2021-03-11 ENCOUNTER — Encounter: Payer: Medicare Other | Admitting: Neurology

## 2021-03-11 ENCOUNTER — Other Ambulatory Visit: Payer: Self-pay

## 2021-03-11 DIAGNOSIS — R202 Paresthesia of skin: Secondary | ICD-10-CM

## 2021-03-11 DIAGNOSIS — M5412 Radiculopathy, cervical region: Secondary | ICD-10-CM

## 2021-03-11 DIAGNOSIS — G5603 Carpal tunnel syndrome, bilateral upper limbs: Secondary | ICD-10-CM

## 2021-03-12 DIAGNOSIS — Z1211 Encounter for screening for malignant neoplasm of colon: Secondary | ICD-10-CM | POA: Diagnosis not present

## 2021-03-12 DIAGNOSIS — Z9181 History of falling: Secondary | ICD-10-CM | POA: Diagnosis not present

## 2021-03-12 DIAGNOSIS — M436 Torticollis: Secondary | ICD-10-CM | POA: Diagnosis not present

## 2021-03-12 DIAGNOSIS — Z79899 Other long term (current) drug therapy: Secondary | ICD-10-CM | POA: Diagnosis not present

## 2021-03-12 DIAGNOSIS — R5383 Other fatigue: Secondary | ICD-10-CM | POA: Diagnosis not present

## 2021-03-12 DIAGNOSIS — E559 Vitamin D deficiency, unspecified: Secondary | ICD-10-CM | POA: Diagnosis not present

## 2021-03-12 DIAGNOSIS — R2681 Unsteadiness on feet: Secondary | ICD-10-CM | POA: Diagnosis not present

## 2021-03-12 DIAGNOSIS — E7800 Pure hypercholesterolemia, unspecified: Secondary | ICD-10-CM | POA: Diagnosis not present

## 2021-03-12 DIAGNOSIS — Z299 Encounter for prophylactic measures, unspecified: Secondary | ICD-10-CM | POA: Diagnosis not present

## 2021-03-12 DIAGNOSIS — K219 Gastro-esophageal reflux disease without esophagitis: Secondary | ICD-10-CM | POA: Diagnosis not present

## 2021-03-12 DIAGNOSIS — R202 Paresthesia of skin: Secondary | ICD-10-CM | POA: Diagnosis not present

## 2021-03-12 DIAGNOSIS — M5412 Radiculopathy, cervical region: Secondary | ICD-10-CM | POA: Diagnosis not present

## 2021-03-12 DIAGNOSIS — Z7189 Other specified counseling: Secondary | ICD-10-CM | POA: Diagnosis not present

## 2021-03-12 DIAGNOSIS — Z0000 Encounter for general adult medical examination without abnormal findings: Secondary | ICD-10-CM | POA: Diagnosis not present

## 2021-03-12 DIAGNOSIS — I2510 Atherosclerotic heart disease of native coronary artery without angina pectoris: Secondary | ICD-10-CM | POA: Diagnosis not present

## 2021-03-12 DIAGNOSIS — I25118 Atherosclerotic heart disease of native coronary artery with other forms of angina pectoris: Secondary | ICD-10-CM | POA: Diagnosis not present

## 2021-03-12 DIAGNOSIS — I10 Essential (primary) hypertension: Secondary | ICD-10-CM | POA: Diagnosis not present

## 2021-03-12 DIAGNOSIS — R29898 Other symptoms and signs involving the musculoskeletal system: Secondary | ICD-10-CM | POA: Diagnosis not present

## 2021-03-12 NOTE — Procedures (Signed)
[facility] Neurology  [address] [place], [address]  [place], [place] [address] Tel: [phone] Fax:  [phone] Test Date:  03/11/2021  Patient: Jamie Rollins DOB: 1940-08-05 Physician: Nita Sickle, DO  Sex: Female Height: 5\' 4"  Ref Phys: , DO  ID#: Nita Sickle   Technician:    Patient Complaints: This is a 80 year old female referred for evaluation of bilateral hand paresthesias.  NCV & EMG Findings: Extensive electrodiagnostic testing of the right upper extremity and additional studies of the left shows:  Right median sensory response shows prolonged latency (4.0 ms).  Left mixed palmar sensory responses show prolonged latency.  Left median and bilateral ulnar motor responses are within normal limits.  Bilateral median and ulnar motor responses are within normal limits. Chronic motor axon loss changes are seen affecting the C5-C6 myotomes, with active denervation isolated to the left deltoid muscle.  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   Bilateral median neuropathy at or distal to the wrist, consistent with a clinical diagnosis of carpal tunnel syndrome.  Overall, these findings are mild in degree electrically and worse on the right.   ___________________________ 62, DO    Nerve Conduction Studies Anti Sensory Summary Table   Stim Site NR Peak (ms) Norm Peak (ms) P-T Amp (V) Norm P-T Amp  Left Median Anti Sensory (2nd Digit)  32C  Wrist    3.3 <3.8 28.7 >10  Right Median Anti Sensory (2nd Digit)  32C  Wrist    4.0 <3.8 23.4 >10  Left Ulnar Anti Sensory (5th Digit)  32C  Wrist    2.8 <3.2 16.8 >5  Right Ulnar Anti Sensory (5th Digit)  32C  Wrist    2.8 <3.2 21.8 >5   Motor Summary Table   Stim Site NR Onset (ms) Norm Onset (ms) O-P Amp (mV) Norm O-P Amp Site1 Site2 Delta-0 (ms) Dist (cm) Vel (m/s) Norm Vel (m/s)  Left Median Motor (Abd Poll Brev)  32C  Wrist    3.4 <4.0 9.8 >5 Elbow Wrist  4.6 27.0 59 >50  Elbow    8.0  8.4         Right Median Motor (Abd Poll Brev)  32C  Wrist    3.8 <4.0 9.0 >5 Elbow Wrist 5.0 27.0 54 >50  Elbow    8.8  8.1         Left Ulnar Motor (Abd Dig Minimi)  32C  Wrist    2.2 <3.1 8.4 >7 B Elbow Wrist 3.5 20.0 57 >50  B Elbow    5.7  7.3  A Elbow B Elbow 2.0 10.0 50 >50  A Elbow    7.7  5.7         Right Ulnar Motor (Abd Dig Minimi)  32C  Wrist    2.0 <3.1 8.0 >7 B Elbow Wrist 3.7 20.0 54 >50  B Elbow    5.7  6.7  A Elbow B Elbow 1.7 10.0 59 >50  A Elbow    7.4  5.6          Comparison Summary Table   Stim Site NR Peak (ms) Norm Peak (ms) P-T Amp (V) Site1 Site2 Delta-P (ms) Norm Delta (ms)  Left Median/Ulnar Palm Comparison (Wrist - 8cm)  32C  Median Palm    2.1 <2.2 45.6 Median Palm Ulnar Palm 0.5   Ulnar Palm    1.6 <2.2 15.4       EMG   Side Muscle Ins Act Fibs  Psw Fasc Number Recrt Dur Dur. Amp Amp. Poly Poly. Comment  Left 1stDorInt Nml Nml Nml Nml Nml Nml Nml Nml Nml Nml Nml Nml N/A  Left Abd Poll Brev Nml Nml Nml Nml Nml Nml Nml Nml Nml Nml Nml Nml N/A  Left PronatorTeres Nml Nml Nml Nml 1- Rapid Some 1+ Some 1+ Some 1+ N/A  Left Biceps Nml Nml Nml Nml 2- Rapid Many 1+ Many 1+ Many 1+ N/A  Left Triceps Nml Nml Nml Nml Nml Nml Nml Nml Nml Nml Nml Nml N/A  Left Deltoid Nml 1+ Nml Nml 2- Rapid Many 1+ Many 1+ Many 1+ N/A  Left ABD Dig Min Nml Nml Nml Nml Nml Nml Nml Nml Nml Nml Nml Nml N/A  Left FlexDigProf 4,5 Nml Nml Nml Nml Nml Nml Nml Nml Nml Nml Nml Nml N/A  Right 1stDorInt Nml Nml Nml Nml Nml Nml Nml Nml Nml Nml Nml Nml N/A  Right Abd Poll Brev Nml Nml Nml Nml Nml Nml Nml Nml Nml Nml Nml Nml N/A  Right PronatorTeres Nml Nml Nml Nml 1- Rapid Some 1+ Some 1+ Some 1+ N/A  Right Biceps Nml Nml Nml Nml 2- Rapid Many 1+ Many 1+ Many 1+ N/A  Right Triceps Nml Nml Nml Nml Nml Nml Nml Nml Nml Nml Nml Nml N/A  Right Deltoid Nml Nml Nml Nml 2- Rapid Many 1+ Many 1+ Most 1+ N/A      Waveforms:

## 2021-03-13 NOTE — Addendum Note (Signed)
Addended by: Karl Luke A on: 03/13/2021 12:32 PM   Modules accepted: Orders

## 2021-03-14 DIAGNOSIS — M436 Torticollis: Secondary | ICD-10-CM | POA: Diagnosis not present

## 2021-03-14 DIAGNOSIS — R29898 Other symptoms and signs involving the musculoskeletal system: Secondary | ICD-10-CM | POA: Diagnosis not present

## 2021-03-14 DIAGNOSIS — R202 Paresthesia of skin: Secondary | ICD-10-CM | POA: Diagnosis not present

## 2021-03-14 DIAGNOSIS — R2681 Unsteadiness on feet: Secondary | ICD-10-CM | POA: Diagnosis not present

## 2021-03-14 DIAGNOSIS — M5412 Radiculopathy, cervical region: Secondary | ICD-10-CM | POA: Diagnosis not present

## 2021-03-14 DIAGNOSIS — Z9181 History of falling: Secondary | ICD-10-CM | POA: Diagnosis not present

## 2021-03-18 DIAGNOSIS — R29898 Other symptoms and signs involving the musculoskeletal system: Secondary | ICD-10-CM | POA: Diagnosis not present

## 2021-03-18 DIAGNOSIS — Z9181 History of falling: Secondary | ICD-10-CM | POA: Diagnosis not present

## 2021-03-18 DIAGNOSIS — R202 Paresthesia of skin: Secondary | ICD-10-CM | POA: Diagnosis not present

## 2021-03-18 DIAGNOSIS — R2681 Unsteadiness on feet: Secondary | ICD-10-CM | POA: Diagnosis not present

## 2021-03-18 DIAGNOSIS — M5412 Radiculopathy, cervical region: Secondary | ICD-10-CM | POA: Diagnosis not present

## 2021-03-18 DIAGNOSIS — M436 Torticollis: Secondary | ICD-10-CM | POA: Diagnosis not present

## 2021-03-19 ENCOUNTER — Other Ambulatory Visit: Payer: Self-pay

## 2021-03-19 DIAGNOSIS — M5412 Radiculopathy, cervical region: Secondary | ICD-10-CM

## 2021-03-24 ENCOUNTER — Other Ambulatory Visit: Payer: Self-pay | Admitting: Internal Medicine

## 2021-03-24 DIAGNOSIS — Z139 Encounter for screening, unspecified: Secondary | ICD-10-CM

## 2021-03-25 ENCOUNTER — Other Ambulatory Visit: Payer: Self-pay

## 2021-03-25 ENCOUNTER — Ambulatory Visit
Admission: RE | Admit: 2021-03-25 | Discharge: 2021-03-25 | Disposition: A | Discharge status: Home / Self Care | Payer: Medicare Other | Source: Ambulatory Visit | Attending: Internal Medicine | Admitting: Internal Medicine

## 2021-03-25 DIAGNOSIS — Z1231 Encounter for screening mammogram for malignant neoplasm of breast: Secondary | ICD-10-CM | POA: Diagnosis not present

## 2021-03-25 DIAGNOSIS — Z139 Encounter for screening, unspecified: Secondary | ICD-10-CM

## 2021-03-31 DIAGNOSIS — R29898 Other symptoms and signs involving the musculoskeletal system: Secondary | ICD-10-CM | POA: Diagnosis not present

## 2021-03-31 DIAGNOSIS — M436 Torticollis: Secondary | ICD-10-CM | POA: Diagnosis not present

## 2021-03-31 DIAGNOSIS — R202 Paresthesia of skin: Secondary | ICD-10-CM | POA: Diagnosis not present

## 2021-03-31 DIAGNOSIS — R2681 Unsteadiness on feet: Secondary | ICD-10-CM | POA: Diagnosis not present

## 2021-03-31 DIAGNOSIS — M5412 Radiculopathy, cervical region: Secondary | ICD-10-CM | POA: Diagnosis not present

## 2021-03-31 DIAGNOSIS — Z9181 History of falling: Secondary | ICD-10-CM | POA: Diagnosis not present

## 2021-04-04 DIAGNOSIS — R262 Difficulty in walking, not elsewhere classified: Secondary | ICD-10-CM | POA: Diagnosis not present

## 2021-04-04 DIAGNOSIS — M5412 Radiculopathy, cervical region: Secondary | ICD-10-CM | POA: Diagnosis not present

## 2021-04-10 DIAGNOSIS — M5412 Radiculopathy, cervical region: Secondary | ICD-10-CM | POA: Diagnosis not present

## 2021-04-10 DIAGNOSIS — R262 Difficulty in walking, not elsewhere classified: Secondary | ICD-10-CM | POA: Diagnosis not present

## 2021-04-15 DIAGNOSIS — R262 Difficulty in walking, not elsewhere classified: Secondary | ICD-10-CM | POA: Diagnosis not present

## 2021-04-15 DIAGNOSIS — M5412 Radiculopathy, cervical region: Secondary | ICD-10-CM | POA: Diagnosis not present

## 2021-04-17 DIAGNOSIS — R262 Difficulty in walking, not elsewhere classified: Secondary | ICD-10-CM | POA: Diagnosis not present

## 2021-04-17 DIAGNOSIS — M5412 Radiculopathy, cervical region: Secondary | ICD-10-CM | POA: Diagnosis not present

## 2021-04-22 DIAGNOSIS — R262 Difficulty in walking, not elsewhere classified: Secondary | ICD-10-CM | POA: Diagnosis not present

## 2021-04-22 DIAGNOSIS — M5412 Radiculopathy, cervical region: Secondary | ICD-10-CM | POA: Diagnosis not present

## 2021-04-24 DIAGNOSIS — R262 Difficulty in walking, not elsewhere classified: Secondary | ICD-10-CM | POA: Diagnosis not present

## 2021-04-24 DIAGNOSIS — M5412 Radiculopathy, cervical region: Secondary | ICD-10-CM | POA: Diagnosis not present

## 2021-05-01 DIAGNOSIS — M5412 Radiculopathy, cervical region: Secondary | ICD-10-CM | POA: Diagnosis not present

## 2021-05-01 DIAGNOSIS — R262 Difficulty in walking, not elsewhere classified: Secondary | ICD-10-CM | POA: Diagnosis not present

## 2021-05-09 DIAGNOSIS — Z9181 History of falling: Secondary | ICD-10-CM | POA: Diagnosis not present

## 2021-05-09 DIAGNOSIS — M542 Cervicalgia: Secondary | ICD-10-CM | POA: Diagnosis not present

## 2021-05-09 DIAGNOSIS — R29898 Other symptoms and signs involving the musculoskeletal system: Secondary | ICD-10-CM | POA: Diagnosis not present

## 2021-05-09 DIAGNOSIS — M2569 Stiffness of other specified joint, not elsewhere classified: Secondary | ICD-10-CM | POA: Diagnosis not present

## 2021-05-09 DIAGNOSIS — R202 Paresthesia of skin: Secondary | ICD-10-CM | POA: Diagnosis not present

## 2021-05-09 DIAGNOSIS — R200 Anesthesia of skin: Secondary | ICD-10-CM | POA: Diagnosis not present

## 2021-05-09 DIAGNOSIS — M5412 Radiculopathy, cervical region: Secondary | ICD-10-CM | POA: Diagnosis not present

## 2021-05-09 DIAGNOSIS — R2681 Unsteadiness on feet: Secondary | ICD-10-CM | POA: Diagnosis not present

## 2021-05-12 DIAGNOSIS — M542 Cervicalgia: Secondary | ICD-10-CM | POA: Diagnosis not present

## 2021-05-12 DIAGNOSIS — I10 Essential (primary) hypertension: Secondary | ICD-10-CM | POA: Diagnosis not present

## 2021-05-12 DIAGNOSIS — Z299 Encounter for prophylactic measures, unspecified: Secondary | ICD-10-CM | POA: Diagnosis not present

## 2021-05-12 DIAGNOSIS — K219 Gastro-esophageal reflux disease without esophagitis: Secondary | ICD-10-CM | POA: Diagnosis not present

## 2021-05-12 DIAGNOSIS — R079 Chest pain, unspecified: Secondary | ICD-10-CM | POA: Diagnosis not present

## 2021-05-13 DIAGNOSIS — M5412 Radiculopathy, cervical region: Secondary | ICD-10-CM | POA: Diagnosis not present

## 2021-05-13 DIAGNOSIS — M2569 Stiffness of other specified joint, not elsewhere classified: Secondary | ICD-10-CM | POA: Diagnosis not present

## 2021-05-13 DIAGNOSIS — R202 Paresthesia of skin: Secondary | ICD-10-CM | POA: Diagnosis not present

## 2021-05-13 DIAGNOSIS — R2681 Unsteadiness on feet: Secondary | ICD-10-CM | POA: Diagnosis not present

## 2021-05-13 DIAGNOSIS — Z9181 History of falling: Secondary | ICD-10-CM | POA: Diagnosis not present

## 2021-05-13 DIAGNOSIS — R200 Anesthesia of skin: Secondary | ICD-10-CM | POA: Diagnosis not present

## 2021-05-13 DIAGNOSIS — R29898 Other symptoms and signs involving the musculoskeletal system: Secondary | ICD-10-CM | POA: Diagnosis not present

## 2021-05-13 DIAGNOSIS — M542 Cervicalgia: Secondary | ICD-10-CM | POA: Diagnosis not present

## 2021-05-15 DIAGNOSIS — R202 Paresthesia of skin: Secondary | ICD-10-CM | POA: Diagnosis not present

## 2021-05-15 DIAGNOSIS — M542 Cervicalgia: Secondary | ICD-10-CM | POA: Diagnosis not present

## 2021-05-15 DIAGNOSIS — R200 Anesthesia of skin: Secondary | ICD-10-CM | POA: Diagnosis not present

## 2021-05-15 DIAGNOSIS — R2681 Unsteadiness on feet: Secondary | ICD-10-CM | POA: Diagnosis not present

## 2021-05-15 DIAGNOSIS — M2569 Stiffness of other specified joint, not elsewhere classified: Secondary | ICD-10-CM | POA: Diagnosis not present

## 2021-05-15 DIAGNOSIS — M5412 Radiculopathy, cervical region: Secondary | ICD-10-CM | POA: Diagnosis not present

## 2021-05-15 DIAGNOSIS — Z9181 History of falling: Secondary | ICD-10-CM | POA: Diagnosis not present

## 2021-05-15 DIAGNOSIS — R29898 Other symptoms and signs involving the musculoskeletal system: Secondary | ICD-10-CM | POA: Diagnosis not present

## 2021-05-20 DIAGNOSIS — M2569 Stiffness of other specified joint, not elsewhere classified: Secondary | ICD-10-CM | POA: Diagnosis not present

## 2021-05-20 DIAGNOSIS — M5412 Radiculopathy, cervical region: Secondary | ICD-10-CM | POA: Diagnosis not present

## 2021-05-20 DIAGNOSIS — R202 Paresthesia of skin: Secondary | ICD-10-CM | POA: Diagnosis not present

## 2021-05-20 DIAGNOSIS — R2681 Unsteadiness on feet: Secondary | ICD-10-CM | POA: Diagnosis not present

## 2021-05-20 DIAGNOSIS — R29898 Other symptoms and signs involving the musculoskeletal system: Secondary | ICD-10-CM | POA: Diagnosis not present

## 2021-05-20 DIAGNOSIS — M542 Cervicalgia: Secondary | ICD-10-CM | POA: Diagnosis not present

## 2021-05-20 DIAGNOSIS — Z9181 History of falling: Secondary | ICD-10-CM | POA: Diagnosis not present

## 2021-05-20 DIAGNOSIS — R200 Anesthesia of skin: Secondary | ICD-10-CM | POA: Diagnosis not present

## 2021-06-12 DIAGNOSIS — I10 Essential (primary) hypertension: Secondary | ICD-10-CM | POA: Diagnosis not present

## 2021-06-12 DIAGNOSIS — Z789 Other specified health status: Secondary | ICD-10-CM | POA: Diagnosis not present

## 2021-06-12 DIAGNOSIS — R42 Dizziness and giddiness: Secondary | ICD-10-CM | POA: Diagnosis not present

## 2021-06-12 DIAGNOSIS — Z299 Encounter for prophylactic measures, unspecified: Secondary | ICD-10-CM | POA: Diagnosis not present

## 2021-06-12 DIAGNOSIS — R2681 Unsteadiness on feet: Secondary | ICD-10-CM | POA: Diagnosis not present

## 2021-06-17 DIAGNOSIS — R2681 Unsteadiness on feet: Secondary | ICD-10-CM | POA: Diagnosis not present

## 2021-06-17 DIAGNOSIS — Z9181 History of falling: Secondary | ICD-10-CM | POA: Diagnosis not present

## 2021-06-23 ENCOUNTER — Ambulatory Visit: Payer: Medicare Other | Admitting: Neurology

## 2021-06-30 DIAGNOSIS — R202 Paresthesia of skin: Secondary | ICD-10-CM | POA: Diagnosis not present

## 2021-06-30 DIAGNOSIS — G319 Degenerative disease of nervous system, unspecified: Secondary | ICD-10-CM | POA: Diagnosis not present

## 2021-06-30 DIAGNOSIS — R200 Anesthesia of skin: Secondary | ICD-10-CM | POA: Diagnosis not present

## 2021-06-30 DIAGNOSIS — H7493 Unspecified disorder of middle ear and mastoid, bilateral: Secondary | ICD-10-CM | POA: Diagnosis not present

## 2021-06-30 DIAGNOSIS — R42 Dizziness and giddiness: Secondary | ICD-10-CM | POA: Diagnosis not present

## 2021-07-02 DIAGNOSIS — Z9181 History of falling: Secondary | ICD-10-CM | POA: Diagnosis not present

## 2021-07-02 DIAGNOSIS — R2681 Unsteadiness on feet: Secondary | ICD-10-CM | POA: Diagnosis not present

## 2021-07-09 DIAGNOSIS — Z9181 History of falling: Secondary | ICD-10-CM | POA: Diagnosis not present

## 2021-07-09 DIAGNOSIS — R2681 Unsteadiness on feet: Secondary | ICD-10-CM | POA: Diagnosis not present

## 2021-07-11 DIAGNOSIS — Z9181 History of falling: Secondary | ICD-10-CM | POA: Diagnosis not present

## 2021-07-11 DIAGNOSIS — R2681 Unsteadiness on feet: Secondary | ICD-10-CM | POA: Diagnosis not present

## 2021-07-11 DIAGNOSIS — K219 Gastro-esophageal reflux disease without esophagitis: Secondary | ICD-10-CM | POA: Diagnosis not present

## 2021-07-16 DIAGNOSIS — Z9181 History of falling: Secondary | ICD-10-CM | POA: Diagnosis not present

## 2021-07-16 DIAGNOSIS — R2681 Unsteadiness on feet: Secondary | ICD-10-CM | POA: Diagnosis not present

## 2021-07-18 DIAGNOSIS — R2681 Unsteadiness on feet: Secondary | ICD-10-CM | POA: Diagnosis not present

## 2021-07-18 DIAGNOSIS — Z9181 History of falling: Secondary | ICD-10-CM | POA: Diagnosis not present

## 2021-07-21 ENCOUNTER — Ambulatory Visit: Payer: Medicare Other | Admitting: Neurology

## 2021-07-21 ENCOUNTER — Other Ambulatory Visit: Payer: Self-pay

## 2021-07-21 ENCOUNTER — Encounter: Payer: Self-pay | Admitting: Neurology

## 2021-07-21 VITALS — BP 129/71 | HR 84 | Ht 64.0 in | Wt 142.0 lb

## 2021-07-21 DIAGNOSIS — M4802 Spinal stenosis, cervical region: Secondary | ICD-10-CM

## 2021-07-21 NOTE — Patient Instructions (Addendum)
MRI cervical spine without contrast    

## 2021-07-21 NOTE — Progress Notes (Signed)
Follow-up Visit   Date: 07/21/21   Jamie Rollins MRN: [id number] DOB: 1941/05/13   Interim History: Jamie Rollins is a 81 y.o. right-handed Caucasian female with COPD, hypertension, and hyperlipidemia returning to the clinic for follow-up of bilateral hand tingling.  The patient was accompanied to the clinic by husband who also provides collateral information.    History of present illness: Starting early in 2021, she began having numbness/tingling/cold sensation of the left hand. She does have weakness in the hand, especially with holding objects and opening jars/bottles. Her right hand began to have similar symptoms in the right hand.  No neck pain. She takes gabapentin 100mg  twice daily.  She has tried wearing a brace and denies any change.,   She has been using for the past several months due to gait difficulty.  She has low back pain and some tingling in the toes.  No radicular pain in the legs. She is getting PT.    She used to work as a .   UPDATE 07/21/2021:  She is here for follow-up. EDX showed moderate C5-6 radiculopathy and mild bilateral CTS. She has completed neck PT and did not appreciate any benefit.  In fact, she feels that her hands are getting weaker, especially the left. She is dropping things much more often and has reduced grip.  Medications:  Current Outpatient Medications on File Prior to Visit  Medication Sig Dispense Refill   albuterol (VENTOLIN HFA) 108 (90 Base) MCG/ACT inhaler Inhale 2 puffs into the lungs every 6 (six) hours as needed for wheezing or shortness of breath.     atorvastatin (LIPITOR) 20 MG tablet Take 20 mg by mouth daily.      docusate sodium (COLACE) 100 MG capsule Take 1 capsule (100 mg total) by mouth 2 (two) times daily. 60 capsule 1   gabapentin (NEURONTIN) 300 MG capsule Take 1 capsule (300 mg total) by mouth at bedtime. (Patient taking differently: Take 300 mg by mouth 2 (two) times daily.) 60 capsule 3   lisinopril  (ZESTRIL) 20 MG tablet      metoprolol succinate (TOPROL-XL) 25 MG 24 hr tablet Take 25 mg by mouth daily.      MULTIPLE VITAMIN PO Take 1 tablet by mouth daily.      Omega-3 Fatty Acids (FISH OIL) 1000 MG CAPS Take 2,000 mg by mouth daily.      SYMBICORT 80-4.5 MCG/ACT inhaler inhale TWO puffs into THE lungs TWICE DAILY 10.2 g 2   telmisartan (MICARDIS) 80 MG tablet Take 1 tablet (80 mg total) by mouth daily. 30 tablet 2   No current facility-administered medications on file prior to visit.    Allergies:  Allergies  Allergen Reactions   Codeine Nausea And Vomiting    Vital Signs:  BP 129/71    Pulse 84    Ht 5\' 4"  (1.626 m)    Wt 142 lb (64.4 kg)    SpO2 97%    BMI 24.37 kg/m   Neurological Exam: MENTAL STATUS including orientation to time, place, person, recent and remote memory, attention span and concentration, language, and fund of knowledge is normal.  Speech is not dysarthric.  CRANIAL NERVES:  Pupils equal round and reactive to light.  Normal conjugate, extra-ocular eye movements in all directions of gaze.  No ptosis .   MOTOR:  Motor strength is 5/5 in all extremities, except 4+/5 intrinsic hand muscles.  No atrophy, fasciculations or abnormal movements.  No pronator drift.  Tone  is normal.    MSRs:  Reflexes are 2+/4 throughout, except 3+/4 at the knees.  SENSORY:  Reduced vibration at the left hand, intact on the right.  COORDINATION/GAIT:  Normal finger-to- nose-finger.  Intact rapid alternating movements bilaterally.  Gait not tested,patient arrived in wheelchair.   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   Bilateral median neuropathy at or distal to the wrist, consistent with a clinical diagnosis of carpal tunnel syndrome.  Overall, these findings are mild in degree electrically and worse on the right.   MRI brain wo contrast 06/30/2021: No evidence of acute intracranial abnormality.    Mild chronic small vessel ischemic changes within the cerebral white  matter and pons.   Mild generalized cerebral atrophy.   Small-volume fluid within the bilateral mastoid air cells.   IMPRESSION/PLAN: Cervical radiculopathy with probable canal stenosis. Exam shows distal hand weakness and brisk patella reflexes, suggestive cervical canal stenosis.   - NCS/EMG shows C5-6 radiculopathy  - No improvement with PT  - Check MRI cervical spine wo contrast  Further recommendations pending results.   Thank you for allowing me to participate in patient's care.  If I can answer any additional questions, I would be pleased to do so.    Sincerely,    Faaris Arizpe K. Allena Katz, DO

## 2021-07-22 DIAGNOSIS — Z9181 History of falling: Secondary | ICD-10-CM | POA: Diagnosis not present

## 2021-07-22 DIAGNOSIS — R2681 Unsteadiness on feet: Secondary | ICD-10-CM | POA: Diagnosis not present

## 2021-07-24 DIAGNOSIS — Z9181 History of falling: Secondary | ICD-10-CM | POA: Diagnosis not present

## 2021-07-24 DIAGNOSIS — R2681 Unsteadiness on feet: Secondary | ICD-10-CM | POA: Diagnosis not present

## 2021-07-29 DIAGNOSIS — Z9181 History of falling: Secondary | ICD-10-CM | POA: Diagnosis not present

## 2021-07-29 DIAGNOSIS — R2681 Unsteadiness on feet: Secondary | ICD-10-CM | POA: Diagnosis not present

## 2021-07-31 DIAGNOSIS — Z9181 History of falling: Secondary | ICD-10-CM | POA: Diagnosis not present

## 2021-07-31 DIAGNOSIS — R2681 Unsteadiness on feet: Secondary | ICD-10-CM | POA: Diagnosis not present

## 2021-08-05 DIAGNOSIS — Z9181 History of falling: Secondary | ICD-10-CM | POA: Diagnosis not present

## 2021-08-05 DIAGNOSIS — R2681 Unsteadiness on feet: Secondary | ICD-10-CM | POA: Diagnosis not present

## 2021-08-07 DIAGNOSIS — Z9181 History of falling: Secondary | ICD-10-CM | POA: Diagnosis not present

## 2021-08-07 DIAGNOSIS — R2681 Unsteadiness on feet: Secondary | ICD-10-CM | POA: Diagnosis not present

## 2021-08-08 ENCOUNTER — Other Ambulatory Visit: Payer: Self-pay

## 2021-08-08 ENCOUNTER — Ambulatory Visit
Admission: RE | Admit: 2021-08-08 | Discharge: 2021-08-08 | Disposition: A | Discharge status: Home / Self Care | Payer: Medicare Other | Source: Ambulatory Visit | Attending: Neurology | Admitting: Neurology

## 2021-08-08 DIAGNOSIS — R200 Anesthesia of skin: Secondary | ICD-10-CM | POA: Diagnosis not present

## 2021-08-08 DIAGNOSIS — M2578 Osteophyte, vertebrae: Secondary | ICD-10-CM | POA: Diagnosis not present

## 2021-08-08 DIAGNOSIS — M4802 Spinal stenosis, cervical region: Secondary | ICD-10-CM

## 2021-08-08 DIAGNOSIS — M542 Cervicalgia: Secondary | ICD-10-CM | POA: Diagnosis not present

## 2021-08-11 ENCOUNTER — Telehealth: Payer: Self-pay | Admitting: Neurology

## 2021-08-11 ENCOUNTER — Other Ambulatory Visit: Payer: Self-pay

## 2021-08-11 DIAGNOSIS — G9520 Unspecified cord compression: Secondary | ICD-10-CM

## 2021-08-11 DIAGNOSIS — M4712 Other spondylosis with myelopathy, cervical region: Secondary | ICD-10-CM

## 2021-08-11 NOTE — Telephone Encounter (Signed)
Patient's spouse called back, please return call.

## 2021-08-11 NOTE — Telephone Encounter (Signed)
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 I attempted to call patient personally at home and cell phone, but there was no answer.  Message left. I will have my staff trying her again.   Refer urgently to [facility] for surgical evaluation.

## 2021-08-11 NOTE — Telephone Encounter (Signed)
Called and spoke to [place] and her husband. 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 Informed patient that we have sent a Urgent referral to [facility] and that someone will contact her for an appointment. I informed patient that if she does not hear back from someone by lunch time tomorrow to give me a call and let me know. Patient verbalized understanding.

## 2021-08-11 NOTE — Telephone Encounter (Signed)
Attempted to call patient and left a message for a call back.

## 2021-08-12 DIAGNOSIS — Z9181 History of falling: Secondary | ICD-10-CM | POA: Diagnosis not present

## 2021-08-12 DIAGNOSIS — R2681 Unsteadiness on feet: Secondary | ICD-10-CM | POA: Diagnosis not present

## 2021-08-14 DIAGNOSIS — R2681 Unsteadiness on feet: Secondary | ICD-10-CM | POA: Diagnosis not present

## 2021-08-14 DIAGNOSIS — Z9181 History of falling: Secondary | ICD-10-CM | POA: Diagnosis not present

## 2021-08-15 DIAGNOSIS — G959 Disease of spinal cord, unspecified: Secondary | ICD-10-CM | POA: Diagnosis not present

## 2021-08-15 DIAGNOSIS — I10 Essential (primary) hypertension: Secondary | ICD-10-CM | POA: Diagnosis not present

## 2021-08-20 ENCOUNTER — Other Ambulatory Visit: Payer: Self-pay | Admitting: Neurological Surgery

## 2021-08-22 DIAGNOSIS — Z9181 History of falling: Secondary | ICD-10-CM | POA: Diagnosis not present

## 2021-08-22 DIAGNOSIS — R2681 Unsteadiness on feet: Secondary | ICD-10-CM | POA: Diagnosis not present

## 2021-08-26 DIAGNOSIS — R2681 Unsteadiness on feet: Secondary | ICD-10-CM | POA: Diagnosis not present

## 2021-08-26 DIAGNOSIS — Z9181 History of falling: Secondary | ICD-10-CM | POA: Diagnosis not present

## 2021-08-28 DIAGNOSIS — Z9181 History of falling: Secondary | ICD-10-CM | POA: Diagnosis not present

## 2021-08-28 DIAGNOSIS — R2681 Unsteadiness on feet: Secondary | ICD-10-CM | POA: Diagnosis not present

## 2021-08-28 NOTE — Progress Notes (Signed)
Surgical Instructions    Your procedure is scheduled on Monday, February 20th, 2023.   Report to [facility] Main Entrance "A" at 08:10 A.M., then check in with the Admitting office.  Call this number if you have problems the morning of surgery:  [phone]   If you have any questions prior to your surgery date call [phone]: Open Monday-Friday 8am-4pm    Remember:  Do not eat after midnight the night before your surgery  You may drink clear liquids until 07:10 the morning of your surgery.   Clear liquids allowed are: Water, Non-Citrus Juices (without pulp), Carbonated Beverages, Clear Tea, Black Coffee ONLY (NO MILK, CREAM OR POWDERED CREAMER of any kind), and Gatorade    Take these medicines the morning of surgery with A SIP OF WATER:   atorvastatin (LIPITOR) gabapentin (NEURONTIN)  metoprolol succinate (TOPROL-XL) omeprazole (PRILOSEC)  SYMBICORT   If needed:  albuterol (VENTOLIN HFA)  nitroGLYCERIN (NITROSTAT)   Please bring all inhalers with you the day of surgery.    As of today, STOP taking any Aspirin (unless otherwise instructed by your surgeon) Aleve, Naproxen, Ibuprofen, Motrin, Advil, Goody's, BC's, all herbal medications, fish oil, and all vitamins.   The day of surgery:          Do not wear jewelry or makeup Do not wear lotions, powders, perfumes, or deodorant. Do not shave 48 hours prior to surgery.   Do not bring valuables to the hospital. Do not wear nail polish, gel polish, artificial nails, or any other type of covering on natural nails (fingers and toes) If you have artificial nails or gel coating that need to be removed by a nail salon, please have this removed prior to surgery. Artificial nails or gel coating may interfere with anesthesia's ability to adequately monitor your vital signs.   [facility] is not responsible for any belongings or valuables. .   Do NOT Smoke (Tobacco/Vaping)  24 hours prior to your procedure  If you use a CPAP at  night, you may bring your mask for your overnight stay.   Contacts, glasses, hearing aids, dentures or partials may not be worn into surgery, please bring cases for these belongings   For patients admitted to the hospital, discharge time will be determined by your treatment team.   Patients discharged the day of surgery will not be allowed to drive home, and someone needs to stay with them for 24 hours.  NO VISITORS WILL BE ALLOWED IN PRE-OP WHERE PATIENTS ARE PREPPED FOR SURGERY.  ONLY 1 SUPPORT PERSON MAY BE PRESENT IN THE WAITING ROOM WHILE YOU ARE IN SURGERY.  IF YOU ARE TO BE ADMITTED, ONCE YOU ARE IN YOUR ROOM YOU WILL BE ALLOWED TWO (2) VISITORS. 1 (ONE) VISITOR MAY STAY OVERNIGHT BUT MUST ARRIVE TO THE ROOM BY 8pm.  Minor children may have two parents present. Special consideration for safety and communication needs will be reviewed on a case by case basis.  Special instructions:    Oral Hygiene is also important to reduce your risk of infection.  Remember - BRUSH YOUR TEETH THE MORNING OF SURGERY WITH YOUR REGULAR TOOTHPASTE   [facility]- Preparing For Surgery  Before surgery, you can play an important role. Because skin is not sterile, your skin needs to be as free of germs as possible. You can reduce the number of germs on your skin by washing with CHG (chlorahexidine gluconate) Soap before surgery.  CHG is an antiseptic cleaner which kills germs and bonds  with the skin to continue killing germs even after washing.     Please do not use if you have an allergy to CHG or antibacterial soaps. If your skin becomes reddened/irritated stop using the CHG.  Do not shave (including legs and underarms) for at least 48 hours prior to first CHG shower. It is OK to shave your face.  Please follow these instructions carefully.     Shower the NIGHT BEFORE SURGERY and the MORNING OF SURGERY with CHG Soap.   If you chose to wash your hair, wash your hair first as usual with your normal shampoo.  After you shampoo, rinse your hair and body thoroughly to remove the shampoo.  Then [employer] and genitals (private parts) with your normal soap and rinse thoroughly to remove soap.  After that Use CHG Soap as you would any other liquid soap. You can apply CHG directly to the skin and wash gently with a scrungie or a clean washcloth.   Apply the CHG Soap to your body ONLY FROM THE NECK DOWN.  Do not use on open wounds or open sores. Avoid contact with your eyes, ears, mouth and genitals (private parts). Wash Face and genitals (private parts)  with your normal soap.   Wash thoroughly, paying special attention to the area where your surgery will be performed.  Thoroughly rinse your body with warm water from the neck down.  DO NOT shower/wash with your normal soap after using and rinsing off the CHG Soap.  Pat yourself dry with a CLEAN TOWEL.  Wear CLEAN PAJAMAS to bed the night before surgery  Place CLEAN SHEETS on your bed the night before your surgery  DO NOT SLEEP WITH PETS.   Day of Surgery:  Take a shower with CHG soap. Wear Clean/Comfortable clothing the morning of surgery Do not apply any deodorants/lotions.   Remember to brush your teeth WITH YOUR REGULAR TOOTHPASTE.    COVID testing  If you are going to stay overnight or be admitted after your procedure/surgery and require a pre-op COVID test, please follow these instructions after your COVID test   You are not required to quarantine however you are required to wear a well-fitting mask when you are out and around people not in your household.  If your mask becomes wet or soiled, replace with a new one.  Wash your hands often with soap and water for 20 seconds or clean your hands with an alcohol-based hand sanitizer that contains at least 60% alcohol.  Do not share personal items.  Notify your provider: if you are in close contact with someone who has COVID  or if you develop a fever of 100.4 or greater, sneezing,  cough, sore throat, shortness of breath or body aches.    Please read over the following fact sheets that you were given.

## 2021-08-29 ENCOUNTER — Encounter (HOSPITAL_COMMUNITY): Payer: Self-pay

## 2021-08-29 ENCOUNTER — Encounter (HOSPITAL_COMMUNITY)
Admission: RE | Admit: 2021-08-29 | Discharge: 2021-08-29 | Disposition: A | Discharge status: Home / Self Care | Payer: Medicare Other | Source: Ambulatory Visit | Attending: Neurological Surgery | Admitting: Neurological Surgery

## 2021-08-29 ENCOUNTER — Other Ambulatory Visit: Payer: Self-pay

## 2021-08-29 VITALS — BP 133/71 | HR 73 | Temp 97.8°F | Ht 64.0 in | Wt 155.5 lb

## 2021-08-29 DIAGNOSIS — J449 Chronic obstructive pulmonary disease, unspecified: Secondary | ICD-10-CM | POA: Insufficient documentation

## 2021-08-29 DIAGNOSIS — M1990 Unspecified osteoarthritis, unspecified site: Secondary | ICD-10-CM | POA: Insufficient documentation

## 2021-08-29 DIAGNOSIS — Z87891 Personal history of nicotine dependence: Secondary | ICD-10-CM | POA: Insufficient documentation

## 2021-08-29 DIAGNOSIS — D649 Anemia, unspecified: Secondary | ICD-10-CM | POA: Insufficient documentation

## 2021-08-29 DIAGNOSIS — G959 Disease of spinal cord, unspecified: Secondary | ICD-10-CM | POA: Diagnosis not present

## 2021-08-29 DIAGNOSIS — I10 Essential (primary) hypertension: Secondary | ICD-10-CM | POA: Insufficient documentation

## 2021-08-29 DIAGNOSIS — M2578 Osteophyte, vertebrae: Secondary | ICD-10-CM | POA: Diagnosis not present

## 2021-08-29 DIAGNOSIS — Z01818 Encounter for other preprocedural examination: Secondary | ICD-10-CM

## 2021-08-29 DIAGNOSIS — Z981 Arthrodesis status: Secondary | ICD-10-CM | POA: Diagnosis not present

## 2021-08-29 DIAGNOSIS — M4322 Fusion of spine, cervical region: Secondary | ICD-10-CM | POA: Diagnosis not present

## 2021-08-29 DIAGNOSIS — I2510 Atherosclerotic heart disease of native coronary artery without angina pectoris: Secondary | ICD-10-CM | POA: Insufficient documentation

## 2021-08-29 DIAGNOSIS — Z20822 Contact with and (suspected) exposure to covid-19: Secondary | ICD-10-CM | POA: Insufficient documentation

## 2021-08-29 DIAGNOSIS — M4802 Spinal stenosis, cervical region: Secondary | ICD-10-CM | POA: Diagnosis not present

## 2021-08-29 DIAGNOSIS — G992 Myelopathy in diseases classified elsewhere: Secondary | ICD-10-CM | POA: Diagnosis not present

## 2021-08-29 LAB — CBC
HCT: 35.8 % — ABNORMAL LOW (ref 36.0–46.0)
Hemoglobin: 11.2 g/dL — ABNORMAL LOW (ref 12.0–15.0)
MCH: 31.5 pg (ref 26.0–34.0)
MCHC: 31.3 g/dL (ref 30.0–36.0)
MCV: 100.8 fL — ABNORMAL HIGH (ref 80.0–100.0)
Platelets: 210 10*3/uL (ref 150–400)
RBC: 3.55 MIL/uL — ABNORMAL LOW (ref 3.87–5.11)
RDW: 13.8 % (ref 11.5–15.5)
WBC: 6.7 10*3/uL (ref 4.0–10.5)
nRBC: 0 % (ref 0.0–0.2)

## 2021-08-29 LAB — TYPE AND SCREEN
ABO/RH(D): O POS
Antibody Screen: NEGATIVE

## 2021-08-29 LAB — BASIC METABOLIC PANEL
Anion gap: 7 (ref 5–15)
BUN: 20 mg/dL (ref 8–23)
CO2: 26 mmol/L (ref 22–32)
Calcium: 11.7 mg/dL — ABNORMAL HIGH (ref 8.9–10.3)
Chloride: 106 mmol/L (ref 98–111)
Creatinine, Ser: 1.27 mg/dL — ABNORMAL HIGH (ref 0.44–1.00)
GFR, Estimated: 43 mL/min — ABNORMAL LOW (ref >60)
Glucose, Bld: 101 mg/dL — ABNORMAL HIGH (ref 70–99)
Potassium: 4.8 mmol/L (ref 3.5–5.1)
Sodium: 139 mmol/L (ref 135–145)

## 2021-08-29 LAB — SURGICAL PCR SCREEN
MRSA, PCR: NEGATIVE
Staphylococcus aureus: NEGATIVE

## 2021-08-29 LAB — SARS CORONAVIRUS 2 (TAT 6-24 HRS): SARS Coronavirus 2: NEGATIVE

## 2021-08-29 NOTE — Progress Notes (Signed)
Anesthesia Chart Review:  Case: [id number] Date/Time: 09/01/21 [id number]   Procedure: C3-4 C4-5 C5-6 ACDF - 3C/RM 21   Anesthesia type: General   Pre-op diagnosis: Cervical myelopathy   Location: [facility] OR ROOM 21 / [place] [facility]   Surgeons: Jamie Part, MD       DISCUSSION: Patient is a 81 year old female scheduled for the above procedure.  History includes former smoker (quit 12/12/13), COPD, HTN, anemia, arthritis, right THA (02/15/19).   Preoperative labs noted. Her Calcium is 11.7, previously 11.0 on 01/28/21, so it does not appear new. Her PCP Dr. Trena Rollins office is currently closed, but I did fax the labs to his office at [phone] for follow-up purposes. Confirmation fax received.   She had recent cardiac testing as outlined below with medical therapy recommended.  08/29/2021 presurgical COVID-19 test is in process.  Anesthesia team to evaluate on the day of surgery.   VS: BP 133/71    Pulse 73    Temp 36.6 C (Oral)    Ht 5\' 4"  (1.626 m)    Wt 70.5 kg    SpO2 97%    BMI 26.69 kg/m    PROVIDERS: Jamie Blitz, MD is PCP [facility]) Jamie Rollins, Jamie Gross, DO is cardiologist ([place]).  She was initially evaluated following July 2022 ED visit for chest pain.  CTA ruled out PE but did show calcifications in the LAD territory.  Troponin was negative.  She had a nonischemic stress test and August 2022 and continued medical therapy recommended.    LABS: Preoperative labs noted. See DISCUSSION. (all labs ordered are listed, but only abnormal results are displayed)  Labs Reviewed  BASIC METABOLIC PANEL - Abnormal; Notable for the following components:      Result Value   Glucose, Bld 101 (*)    Creatinine, Ser 1.27 (*)    Calcium 11.7 (*)    GFR, Estimated 43 (*)    All other components within normal limits  CBC - Abnormal; Notable for the following components:   RBC 3.55 (*)    Hemoglobin 11.2 (*)    HCT 35.8 (*)    MCV 100.8 (*)    All other components within normal  limits  SURGICAL PCR SCREEN  SARS CORONAVIRUS 2 (TAT 6-24 HRS)  TYPE AND SCREEN     IMAGES: MRI C-spine 08/08/21: IMPRESSION: 1. Focal compression of the spinal cord at C3-4 and C4-5 with associated cord edema. 2. At C3-4 there is a broad-based disc bulge flattening ventral thecal sac. Moderate left and mild right facet arthropathy. Bilateral uncovertebral degenerative changes. Moderate right foraminal stenosis. Severe left foraminal stenosis. Severe spinal stenosis. 3. At C4-5 there is a broad-based disc bulge. Severe left and mild right facet arthropathy. Severe spinal stenosis. Severe bilateral foraminal stenosis. 4. At C5-6 there is a broad-based disc osteophyte complex. Moderate bilateral facet arthropathy. Bilateral uncovertebral degenerative changes. Moderate spinal stenosis. 5. At C6-7 there is a mild broad-based disc bulge. Mild left foraminal stenosis. Moderate right foraminal stenosis. Mild spinal stenosis.   EKG: 08/29/21: NSR   CV: Nuclear stress test 02/21/21 [facility]): 1. No reversible ischemia or infarction.  2. Normal left ventricular wall motion.  3. Left ventricular ejection fraction 90%  4. Non invasive risk stratification: Low   Echo 01/28/21 [facility] CE): Summary    1. The left ventricle is normal in size with normal wall thickness.    2. The left ventricular systolic function is normal, LVEF is visually  estimated at 65-70%.  3. There is grade I diastolic dysfunction (impaired relaxation).    4. The left atrium is mildly dilated in size.    5. The right ventricle is normal in size, with normal systolic function.    6. There is no pulmonary hypertension, estimated pulmonary artery systolic  pressure is 29 mmHg.    7. The right atrium is mildly dilated  in size.   Past Medical History:  Diagnosis Date   Anemia    Arthritis    COPD (chronic obstructive pulmonary disease) ([place])    Hypertension     Past Surgical History:  Procedure Laterality Date    CHOLECYSTECTOMY     EYE SURGERY Bilateral    cataract removal   TOTAL HIP ARTHROPLASTY Right 02/15/2019   TOTAL HIP ARTHROPLASTY Right 02/15/2019   Procedure: RIGHT TOTAL HIP ARTHROPLASTY DIRECT ANTERIOR;  Surgeon: Jamie Killings, MD;  Location: [place];  Service: Orthopedics;  Laterality: Right;    MEDICATIONS:  albuterol (VENTOLIN HFA) 108 (90 Base) MCG/ACT inhaler   atorvastatin (LIPITOR) 20 MG tablet   docusate sodium (COLACE) 100 MG capsule   ferrous gluconate (FERGON) 324 MG tablet   gabapentin (NEURONTIN) 300 MG capsule   lisinopril (ZESTRIL) 20 MG tablet   metoprolol succinate (TOPROL-XL) 25 MG 24 hr tablet   MULTIPLE VITAMIN PO   nitroGLYCERIN (NITROSTAT) 0.4 MG SL tablet   Omega-3 Fatty Acids (FISH OIL) 1000 MG CAPS   omeprazole (PRILOSEC) 40 MG capsule   SYMBICORT 160-4.5 MCG/ACT inhaler   telmisartan (MICARDIS) 80 MG tablet   No current facility-administered medications for this encounter.    Jamie Gianotti, PA-C Surgical Short Stay/Anesthesiology [facility] Phone [phone] [facility] Phone [phone] 08/29/2021 5:09 PM

## 2021-08-29 NOTE — Anesthesia Preprocedure Evaluation (Addendum)
Anesthesia Evaluation  Patient identified by MRN, date of birth, ID band Patient awake    Reviewed: Allergy & Precautions, NPO status , Patient's Chart, lab work & pertinent test results  History of Anesthesia Complications Negative for: history of anesthetic complications  Airway Mallampati: I  TM Distance: >3 FB Neck ROM: Full    Dental  (+) Dental Advisory Given, Edentulous Upper   Pulmonary COPD,  COPD inhaler, former smoker,    Pulmonary exam normal        Cardiovascular hypertension, Pt. on medications and Pt. on home beta blockers Normal cardiovascular exam  Nuclear stress test 02/21/21 [facility]): 1. No reversible ischemia or infarction.  2. Normal left ventricular wall motion.  3. Left ventricular ejection fraction 90%  4. Non invasive risk stratification: Low   Echo 01/28/21 [facility] CE): Summary  1. The left ventricle is normal in size with normal wall thickness.  2. The left ventricular systolic function is normal, LVEF is visually  estimated at 65-70%.  3. There is grade I diastolic dysfunction (impaired relaxation).  4. The left atrium is mildly dilated in size.  5. The right ventricle is normal in size, with normal systolic function.  6. There is no pulmonary hypertension, estimated pulmonary artery systolic  pressure is 29 mmHg.  7. The right atrium is mildly dilated in size.     Neuro/Psych negative neurological ROS     GI/Hepatic negative GI ROS, Neg liver ROS,   Endo/Other  negative endocrine ROS  Renal/GU negative Renal ROS     Musculoskeletal  (+) Arthritis ,   Abdominal   Peds  Hematology  (+) Blood dyscrasia, anemia ,   Anesthesia Other Findings   Reproductive/Obstetrics                           Anesthesia Physical Anesthesia Plan  ASA: 3  Anesthesia Plan: General   Post-op Pain Management: Tylenol PO (pre-op)*   Induction: Intravenous  PONV  Risk Score and Plan: 4 or greater and Ondansetron, Dexamethasone and Diphenhydramine  Airway Management Planned: Oral ETT and Video Laryngoscope Planned  Additional Equipment:   Intra-op Plan:   Post-operative Plan: Extubation in OR  Informed Consent: I have reviewed the patients History and Physical, chart, labs and discussed the procedure including the risks, benefits and alternatives for the proposed anesthesia with the patient or authorized representative who has indicated his/her understanding and acceptance.     Dental advisory given  Plan Discussed with: Anesthesiologist and CRNA  Anesthesia Plan Comments: (PAT note written 08/29/2021 by [name], PA-C. )      Anesthesia Quick Evaluation

## 2021-08-29 NOTE — Progress Notes (Signed)
PCP - Kirstie Peri Cardiologist - Soheil Assar in [place] note in CE  Chest x-ray - 01/28/21 EKG - 08/29/21 Stress Test - 02/21/21 ECHO - 01/28/21 Cardiac Cath - Denies  Sleep Study - Denies  DM - Denies  ERAS Protcol -Yes  COVID TEST- 08/29/21   Anesthesia review: Yes cardiac history  Patient denies shortness of breath, fever, cough and chest pain at PAT appointment   All instructions explained to the patient, with a verbal understanding of the material. Patient agrees to go over the instructions while at home for a better understanding. Patient also instructed to wear a mask while in public after being tested for COVID-19. The opportunity to ask questions was provided.

## 2021-09-01 ENCOUNTER — Other Ambulatory Visit: Payer: Self-pay

## 2021-09-01 ENCOUNTER — Inpatient Hospital Stay (HOSPITAL_COMMUNITY): Payer: Medicare Other

## 2021-09-01 ENCOUNTER — Inpatient Hospital Stay (HOSPITAL_COMMUNITY)
Admission: RE | Admit: 2021-09-01 | Discharge: 2021-09-02 | DRG: 472 | Disposition: A | Discharge status: Home / Self Care | Payer: Medicare Other | Attending: Neurological Surgery | Admitting: Neurological Surgery

## 2021-09-01 ENCOUNTER — Encounter (HOSPITAL_COMMUNITY)
Admission: RE | Disposition: A | Discharge status: Home / Self Care | Payer: Self-pay | Source: Home / Self Care | Attending: Neurological Surgery

## 2021-09-01 ENCOUNTER — Inpatient Hospital Stay (HOSPITAL_COMMUNITY): Payer: Medicare Other | Admitting: Anesthesiology

## 2021-09-01 ENCOUNTER — Encounter (HOSPITAL_COMMUNITY): Payer: Self-pay | Admitting: Neurological Surgery

## 2021-09-01 DIAGNOSIS — Z981 Arthrodesis status: Secondary | ICD-10-CM | POA: Diagnosis not present

## 2021-09-01 DIAGNOSIS — Z87891 Personal history of nicotine dependence: Secondary | ICD-10-CM

## 2021-09-01 DIAGNOSIS — J449 Chronic obstructive pulmonary disease, unspecified: Secondary | ICD-10-CM

## 2021-09-01 DIAGNOSIS — M4802 Spinal stenosis, cervical region: Secondary | ICD-10-CM

## 2021-09-01 DIAGNOSIS — I10 Essential (primary) hypertension: Secondary | ICD-10-CM

## 2021-09-01 DIAGNOSIS — M1990 Unspecified osteoarthritis, unspecified site: Secondary | ICD-10-CM | POA: Diagnosis present

## 2021-09-01 DIAGNOSIS — Z20822 Contact with and (suspected) exposure to covid-19: Secondary | ICD-10-CM | POA: Diagnosis present

## 2021-09-01 DIAGNOSIS — Z419 Encounter for procedure for purposes other than remedying health state, unspecified: Secondary | ICD-10-CM

## 2021-09-01 DIAGNOSIS — M2578 Osteophyte, vertebrae: Secondary | ICD-10-CM | POA: Diagnosis present

## 2021-09-01 DIAGNOSIS — G992 Myelopathy in diseases classified elsewhere: Secondary | ICD-10-CM | POA: Diagnosis present

## 2021-09-01 DIAGNOSIS — G959 Disease of spinal cord, unspecified: Secondary | ICD-10-CM | POA: Diagnosis present

## 2021-09-01 DIAGNOSIS — M4322 Fusion of spine, cervical region: Secondary | ICD-10-CM | POA: Diagnosis not present

## 2021-09-01 HISTORY — PX: ANTERIOR CERVICAL DECOMP/DISCECTOMY FUSION: SHX1161

## 2021-09-01 SURGERY — ANTERIOR CERVICAL DECOMPRESSION/DISCECTOMY FUSION 3 LEVELS
Anesthesia: General | Site: Spine Cervical

## 2021-09-01 MED ORDER — FENTANYL CITRATE (PF) 100 MCG/2ML IJ SOLN: 25.0000 ug | INTRAMUSCULAR | Status: DC | PRN

## 2021-09-01 MED ORDER — ONDANSETRON HCL 4 MG/2ML IJ SOLN: 4.0000 mg | Freq: Four times a day (QID) | INTRAMUSCULAR | Status: DC | PRN

## 2021-09-01 MED ORDER — OXYCODONE HCL 5 MG PO TABS
5.0000 mg | ORAL_TABLET | ORAL | Status: DC | PRN
  Administered 2021-09-01 – 2021-09-02 (×3): 5 mg via ORAL
  Filled 2021-09-01 (×3): qty 1

## 2021-09-01 MED ORDER — FLUTICASONE FUROATE-VILANTEROL 200-25 MCG/ACT IN AEPB
1.0000 | INHALATION_SPRAY | Freq: Every day | RESPIRATORY_TRACT | Status: DC
  Filled 2021-09-01: qty 28

## 2021-09-01 MED ORDER — ONDANSETRON HCL 4 MG/2ML IJ SOLN
INTRAMUSCULAR | Status: AC
  Filled 2021-09-01: qty 2

## 2021-09-01 MED ORDER — PROMETHAZINE HCL 25 MG/ML IJ SOLN: 6.2500 mg | INTRAMUSCULAR | Status: DC | PRN

## 2021-09-01 MED ORDER — PHENYLEPHRINE 40 MCG/ML (10ML) SYRINGE FOR IV PUSH (FOR BLOOD PRESSURE SUPPORT)
PREFILLED_SYRINGE | INTRAVENOUS | Status: DC | PRN
Start: 2021-09-01 — End: 2021-09-01
  Administered 2021-09-01 (×2): 80 ug via INTRAVENOUS
  Administered 2021-09-01: 120 ug via INTRAVENOUS

## 2021-09-01 MED ORDER — ACETAMINOPHEN 10 MG/ML IV SOLN
INTRAVENOUS | Status: DC | PRN
  Administered 2021-09-01: 1000 mg via INTRAVENOUS

## 2021-09-01 MED ORDER — ALBUTEROL SULFATE (2.5 MG/3ML) 0.083% IN NEBU: 2.5000 mg | INHALATION_SOLUTION | Freq: Four times a day (QID) | RESPIRATORY_TRACT | Status: DC | PRN

## 2021-09-01 MED ORDER — THROMBIN 5000 UNITS EX SOLR
CUTANEOUS | Status: AC
  Filled 2021-09-01: qty 5000

## 2021-09-01 MED ORDER — ATORVASTATIN CALCIUM 10 MG PO TABS
20.0000 mg | ORAL_TABLET | Freq: Every day | ORAL | Status: DC
Start: 2021-09-02 — End: 2021-09-02

## 2021-09-01 MED ORDER — OXYCODONE HCL 5 MG PO TABS: 10.0000 mg | ORAL_TABLET | ORAL | Status: DC | PRN

## 2021-09-01 MED ORDER — 0.9 % SODIUM CHLORIDE (POUR BTL) OPTIME
TOPICAL | Status: DC | PRN
  Administered 2021-09-01: 1000 mL

## 2021-09-01 MED ORDER — GABAPENTIN 300 MG PO CAPS
300.0000 mg | ORAL_CAPSULE | Freq: Every day | ORAL | Status: DC
  Administered 2021-09-01: 300 mg via ORAL
  Filled 2021-09-01: qty 1

## 2021-09-01 MED ORDER — LACTATED RINGERS IV SOLN: INTRAVENOUS | Status: DC

## 2021-09-01 MED ORDER — HYDROMORPHONE HCL 1 MG/ML IJ SOLN: 1.0000 mg | INTRAMUSCULAR | Status: DC | PRN

## 2021-09-01 MED ORDER — CEFAZOLIN SODIUM-DEXTROSE 2-4 GM/100ML-% IV SOLN
2.0000 g | Freq: Two times a day (BID) | INTRAVENOUS | Status: AC
  Administered 2021-09-01: 2 g via INTRAVENOUS
  Filled 2021-09-01: qty 100

## 2021-09-01 MED ORDER — FERROUS GLUCONATE 324 (38 FE) MG PO TABS
324.0000 mg | ORAL_TABLET | Freq: Every day | ORAL | Status: DC
  Administered 2021-09-02: 324 mg via ORAL
  Filled 2021-09-01: qty 1

## 2021-09-01 MED ORDER — THROMBIN 5000 UNITS EX SOLR: OROMUCOSAL | Status: DC | PRN

## 2021-09-01 MED ORDER — SODIUM CHLORIDE 0.9 % IV SOLN
250.0000 mL | INTRAVENOUS | Status: DC
  Administered 2021-09-01: 250 mL via INTRAVENOUS

## 2021-09-01 MED ORDER — ADULT MULTIVITAMIN W/MINERALS CH
1.0000 | ORAL_TABLET | Freq: Every day | ORAL | Status: DC
  Administered 2021-09-01 – 2021-09-02 (×2): 1 via ORAL
  Filled 2021-09-01 (×2): qty 1

## 2021-09-01 MED ORDER — SODIUM CHLORIDE 0.9% FLUSH: 3.0000 mL | Freq: Two times a day (BID) | INTRAVENOUS | Status: DC

## 2021-09-01 MED ORDER — FENTANYL CITRATE (PF) 100 MCG/2ML IJ SOLN
INTRAMUSCULAR | Status: AC
Start: 2021-09-01 — End: 2021-09-02
  Filled 2021-09-01: qty 2

## 2021-09-01 MED ORDER — POLYETHYLENE GLYCOL 3350 17 G PO PACK: 17.0000 g | PACK | Freq: Every day | ORAL | Status: DC | PRN

## 2021-09-01 MED ORDER — DOCUSATE SODIUM 100 MG PO CAPS
100.0000 mg | ORAL_CAPSULE | Freq: Two times a day (BID) | ORAL | Status: DC
  Administered 2021-09-01 – 2021-09-02 (×2): 100 mg via ORAL
  Filled 2021-09-01 (×2): qty 1

## 2021-09-01 MED ORDER — ROCURONIUM BROMIDE 10 MG/ML (PF) SYRINGE
PREFILLED_SYRINGE | INTRAVENOUS | Status: AC
  Filled 2021-09-01: qty 10

## 2021-09-01 MED ORDER — PHENOL 1.4 % MT LIQD: 1.0000 | OROMUCOSAL | Status: DC | PRN

## 2021-09-01 MED ORDER — DEXAMETHASONE SODIUM PHOSPHATE 10 MG/ML IJ SOLN
INTRAMUSCULAR | Status: AC
  Filled 2021-09-01: qty 1

## 2021-09-01 MED ORDER — SODIUM CHLORIDE 0.9% FLUSH: 3.0000 mL | INTRAVENOUS | Status: DC | PRN

## 2021-09-01 MED ORDER — LISINOPRIL 20 MG PO TABS: 20.0000 mg | ORAL_TABLET | Freq: Every day | ORAL | Status: DC

## 2021-09-01 MED ORDER — PANTOPRAZOLE SODIUM 40 MG PO TBEC
40.0000 mg | DELAYED_RELEASE_TABLET | Freq: Every day | ORAL | Status: DC
  Administered 2021-09-02: 40 mg via ORAL
  Filled 2021-09-01: qty 1

## 2021-09-01 MED ORDER — DEXAMETHASONE SODIUM PHOSPHATE 10 MG/ML IJ SOLN
INTRAMUSCULAR | Status: DC | PRN
  Administered 2021-09-01: 4 mg via INTRAVENOUS

## 2021-09-01 MED ORDER — CHLORHEXIDINE GLUCONATE 0.12 % MT SOLN
15.0000 mL | Freq: Once | OROMUCOSAL | Status: AC
  Administered 2021-09-01: 15 mL via OROMUCOSAL
  Filled 2021-09-01: qty 15

## 2021-09-01 MED ORDER — ONDANSETRON HCL 4 MG/2ML IJ SOLN
INTRAMUSCULAR | Status: DC | PRN
Start: 2021-09-01 — End: 2021-09-01
  Administered 2021-09-01: 4 mg via INTRAVENOUS

## 2021-09-01 MED ORDER — ALBUTEROL SULFATE HFA 108 (90 BASE) MCG/ACT IN AERS: 2.0000 | INHALATION_SPRAY | Freq: Four times a day (QID) | RESPIRATORY_TRACT | Status: DC | PRN

## 2021-09-01 MED ORDER — ACETAMINOPHEN 10 MG/ML IV SOLN
INTRAVENOUS | Status: AC
  Filled 2021-09-01: qty 100

## 2021-09-01 MED ORDER — FENTANYL CITRATE (PF) 250 MCG/5ML IJ SOLN
INTRAMUSCULAR | Status: AC
  Filled 2021-09-01: qty 5

## 2021-09-01 MED ORDER — ORAL CARE MOUTH RINSE: 15.0000 mL | Freq: Once | OROMUCOSAL | Status: AC

## 2021-09-01 MED ORDER — CHLORHEXIDINE GLUCONATE CLOTH 2 % EX PADS: 6.0000 | MEDICATED_PAD | Freq: Once | CUTANEOUS | Status: DC

## 2021-09-01 MED ORDER — LIDOCAINE-EPINEPHRINE 1 %-1:100000 IJ SOLN
INTRAMUSCULAR | Status: DC | PRN
  Administered 2021-09-01: 7 mL

## 2021-09-01 MED ORDER — CEFAZOLIN SODIUM-DEXTROSE 2-4 GM/100ML-% IV SOLN
2.0000 g | INTRAVENOUS | Status: AC
  Administered 2021-09-01: 2 g via INTRAVENOUS
  Filled 2021-09-01: qty 100

## 2021-09-01 MED ORDER — LIDOCAINE 2% (20 MG/ML) 5 ML SYRINGE
INTRAMUSCULAR | Status: DC | PRN
  Administered 2021-09-01: 100 mg via INTRAVENOUS

## 2021-09-01 MED ORDER — DIPHENHYDRAMINE HCL 50 MG/ML IJ SOLN
INTRAMUSCULAR | Status: AC
  Filled 2021-09-01: qty 1

## 2021-09-01 MED ORDER — MENTHOL 3 MG MT LOZG: 1.0000 | LOZENGE | OROMUCOSAL | Status: DC | PRN

## 2021-09-01 MED ORDER — LIDOCAINE 2% (20 MG/ML) 5 ML SYRINGE
INTRAMUSCULAR | Status: AC
  Filled 2021-09-01: qty 5

## 2021-09-01 MED ORDER — ONDANSETRON HCL 4 MG PO TABS
4.0000 mg | ORAL_TABLET | Freq: Four times a day (QID) | ORAL | Status: DC | PRN
  Administered 2021-09-01: 4 mg via ORAL
  Filled 2021-09-01: qty 1

## 2021-09-01 MED ORDER — ACETAMINOPHEN 650 MG RE SUPP: 650.0000 mg | RECTAL | Status: DC | PRN

## 2021-09-01 MED ORDER — AMISULPRIDE (ANTIEMETIC) 5 MG/2ML IV SOLN: 10.0000 mg | Freq: Once | INTRAVENOUS | Status: DC | PRN

## 2021-09-01 MED ORDER — LIDOCAINE-EPINEPHRINE 1 %-1:100000 IJ SOLN
INTRAMUSCULAR | Status: AC
  Filled 2021-09-01: qty 1

## 2021-09-01 MED ORDER — PHENYLEPHRINE HCL-NACL 20-0.9 MG/250ML-% IV SOLN
INTRAVENOUS | Status: DC | PRN
  Administered 2021-09-01: 25 ug/min via INTRAVENOUS

## 2021-09-01 MED ORDER — PROPOFOL 10 MG/ML IV BOLUS
INTRAVENOUS | Status: DC | PRN
  Administered 2021-09-01: 140 mg via INTRAVENOUS

## 2021-09-01 MED ORDER — LACTATED RINGERS IV SOLN: INTRAVENOUS | Status: DC | PRN

## 2021-09-01 MED ORDER — FENTANYL CITRATE (PF) 250 MCG/5ML IJ SOLN
INTRAMUSCULAR | Status: DC | PRN
  Administered 2021-09-01 (×2): 50 ug via INTRAVENOUS
  Administered 2021-09-01: 100 ug via INTRAVENOUS

## 2021-09-01 MED ORDER — NITROGLYCERIN 0.4 MG SL SUBL: 0.4000 mg | SUBLINGUAL_TABLET | SUBLINGUAL | Status: DC | PRN

## 2021-09-01 MED ORDER — FENTANYL CITRATE (PF) 100 MCG/2ML IJ SOLN
25.0000 ug | INTRAMUSCULAR | Status: DC | PRN
  Administered 2021-09-01 (×2): 25 ug via INTRAVENOUS

## 2021-09-01 MED ORDER — METOPROLOL SUCCINATE ER 25 MG PO TB24
25.0000 mg | ORAL_TABLET | Freq: Every day | ORAL | Status: DC
Start: 2021-09-02 — End: 2021-09-02
  Administered 2021-09-02: 25 mg via ORAL
  Filled 2021-09-01: qty 1

## 2021-09-01 MED ORDER — IRBESARTAN 150 MG PO TABS: 300.0000 mg | ORAL_TABLET | Freq: Every day | ORAL | Status: DC

## 2021-09-01 MED ORDER — ROCURONIUM BROMIDE 10 MG/ML (PF) SYRINGE
PREFILLED_SYRINGE | INTRAVENOUS | Status: DC | PRN
Start: 2021-09-01 — End: 2021-09-01
  Administered 2021-09-01: 80 mg via INTRAVENOUS
  Administered 2021-09-01: 20 mg via INTRAVENOUS

## 2021-09-01 MED ORDER — PHENYLEPHRINE 40 MCG/ML (10ML) SYRINGE FOR IV PUSH (FOR BLOOD PRESSURE SUPPORT)
PREFILLED_SYRINGE | INTRAVENOUS | Status: AC
  Filled 2021-09-01: qty 10

## 2021-09-01 MED ORDER — PROPOFOL 10 MG/ML IV BOLUS
INTRAVENOUS | Status: AC
  Filled 2021-09-01: qty 20

## 2021-09-01 MED ORDER — CYCLOBENZAPRINE HCL 10 MG PO TABS: 10.0000 mg | ORAL_TABLET | Freq: Three times a day (TID) | ORAL | Status: DC | PRN

## 2021-09-01 MED ORDER — ACETAMINOPHEN 325 MG PO TABS: 650.0000 mg | ORAL_TABLET | ORAL | Status: DC | PRN

## 2021-09-01 SURGICAL SUPPLY — 56 items
ADH SKN CLS APL DERMABOND .7 (GAUZE/BANDAGES/DRESSINGS) ×1
APL SKNCLS STERI-STRIP NONHPOA (GAUZE/BANDAGES/DRESSINGS)
BAG COUNTER SPONGE SURGICOUNT (BAG) ×3 IMPLANT
BAG SPNG CNTER NS LX DISP (BAG) ×1
BAND INSRT 18 STRL LF DISP RB (MISCELLANEOUS) ×2
BAND RUBBER #18 3X1/16 STRL (MISCELLANEOUS) ×6 IMPLANT
BENZOIN TINCTURE PRP APPL 2/3 (GAUZE/BANDAGES/DRESSINGS) IMPLANT
BLADE CLIPPER SURG (BLADE) IMPLANT
BLADE SURG 11 STRL SS (BLADE) ×3 IMPLANT
BUR MATCHSTICK NEURO 3.0 LAGG (BURR) ×3 IMPLANT
CANISTER SUCT 3000ML PPV (MISCELLANEOUS) ×3 IMPLANT
DECANTER SPIKE VIAL GLASS SM (MISCELLANEOUS) ×2 IMPLANT
DERMABOND ADVANCED (GAUZE/BANDAGES/DRESSINGS) ×1
DERMABOND ADVANCED .7 DNX12 (GAUZE/BANDAGES/DRESSINGS) ×2 IMPLANT
DRAPE C-ARM 42X72 X-RAY (DRAPES) ×6 IMPLANT
DRAPE HALF SHEET 40X57 (DRAPES) IMPLANT
DRAPE LAPAROTOMY 100X72 PEDS (DRAPES) ×3 IMPLANT
DRAPE MICROSCOPE LEICA (MISCELLANEOUS) ×3 IMPLANT
DURAPREP 6ML APPLICATOR 50/CS (WOUND CARE) ×3 IMPLANT
ELECT COATED BLADE 2.86 ST (ELECTRODE) ×3 IMPLANT
ELECT REM PT RETURN 9FT ADLT (ELECTROSURGICAL) ×2
ELECTRODE REM PT RTRN 9FT ADLT (ELECTROSURGICAL) ×2 IMPLANT
GAUZE 4X4 16PLY ~~LOC~~+RFID DBL (SPONGE) IMPLANT
GLOVE EXAM NITRILE LRG STRL (GLOVE) IMPLANT
GLOVE EXAM NITRILE XL STR (GLOVE) IMPLANT
GLOVE EXAM NITRILE XS STR PU (GLOVE) IMPLANT
GLOVE SURG LTX SZ7.5 (GLOVE) ×3 IMPLANT
GLOVE SURG POLYISO LF SZ7 (GLOVE) ×2 IMPLANT
GLOVE SURG UNDER POLY LF SZ7.5 (GLOVE) ×10 IMPLANT
GOWN STRL REUS W/ TWL LRG LVL3 (GOWN DISPOSABLE) ×4 IMPLANT
GOWN STRL REUS W/ TWL XL LVL3 (GOWN DISPOSABLE) IMPLANT
GOWN STRL REUS W/TWL 2XL LVL3 (GOWN DISPOSABLE) IMPLANT
GOWN STRL REUS W/TWL LRG LVL3 (GOWN DISPOSABLE) ×4
GOWN STRL REUS W/TWL XL LVL3 (GOWN DISPOSABLE)
HEMOSTAT POWDER KIT SURGIFOAM (HEMOSTASIS) ×3 IMPLANT
KIT BASIN OR (CUSTOM PROCEDURE TRAY) ×3 IMPLANT
KIT TURNOVER KIT B (KITS) ×3 IMPLANT
NDL SPNL 18GX3.5 QUINCKE PK (NEEDLE) ×2 IMPLANT
NEEDLE HYPO 22GX1.5 SAFETY (NEEDLE) ×3 IMPLANT
NEEDLE SPNL 18GX3.5 QUINCKE PK (NEEDLE) ×2 IMPLANT
NS IRRIG 1000ML POUR BTL (IV SOLUTION) ×3 IMPLANT
PACK LAMINECTOMY NEURO (CUSTOM PROCEDURE TRAY) ×3 IMPLANT
PAD ARMBOARD 7.5X6 YLW CONV (MISCELLANEOUS) ×10 IMPLANT
PIN DISTRACTION 14MM (PIN) ×2 IMPLANT
PLATE ATLANTIS VISION 55 (Plate) ×1 IMPLANT
SCREW SPINAL 4.0X14MM TITANIUM (Screw) ×8 IMPLANT
SPACER BONE CORNERSTONE 6X14 (Orthopedic Implant) ×3 IMPLANT
SPONGE INTESTINAL PEANUT (DISPOSABLE) ×3 IMPLANT
STAPLER VISISTAT 35W (STAPLE) IMPLANT
SUT MNCRL AB 3-0 PS2 18 (SUTURE) ×3 IMPLANT
SUT VIC AB 3-0 SH 8-18 (SUTURE) ×4 IMPLANT
TAPE CLOTH 3X10 TAN LF (GAUZE/BANDAGES/DRESSINGS) ×3 IMPLANT
TOWEL GREEN STERILE (TOWEL DISPOSABLE) ×3 IMPLANT
TOWEL GREEN STERILE FF (TOWEL DISPOSABLE) ×3 IMPLANT
TRAY FOLEY MTR SLVR 16FR STAT (SET/KITS/TRAYS/PACK) ×3 IMPLANT
WATER STERILE IRR 1000ML POUR (IV SOLUTION) ×3 IMPLANT

## 2021-09-01 NOTE — Op Note (Signed)
PATIENT: Jamie Rollins  PROCEDURE DATE: 09/01/21  PRE-OPERATIVE DIAGNOSIS:  Cervical myelopathy   POST-OPERATIVE DIAGNOSIS:  Same   PROCEDURE:  C3-C4, C4-5, C5-6 Anterior Cervical Discectomy and Instrumented Fusion   SURGEON:  Surgeon(s) and Role:    Jadene Pierini, MD - Primary    Julio Sicks, MD - Assisting   ANESTHESIA: ETGA   BRIEF HISTORY: This is a 81 year old woman who presented with progressive BUE/BLE weakness / numbness with poor fine motor function and gait ataxia c/w cervical myelopathy. Workup showed severe cervical stenosis at 3 levels with cord signal change. We discussed the situation and I recommended surgical intervention in the form of a 3 level ACDF. Along with the usual risks of surgery, we discussed the increased risk of dysphagia in larger anterior exposures with older patients, but I think ACDF is still the best of the two approaches for her. This was discussed with the patient as well as risks, benefits, and alternatives and the patient wished to proceed with surgical treatment.   OPERATIVE DETAIL: The patient was taken to the operating room and placed on the OR table in the supine position. A formal time out was performed with two patient identifiers and confirmed the operative site. Anesthesia was induced by the anesthesia team.  Fluoroscopy was used to localize the surgical level and an incision was marked in a skin crease. The area was then prepped and draped in a sterile fashion. A transverse linear incision was made on the right side of the neck. The platysma was divided and the sternocleidomastoid muscle was identified. The carotid sheath was palpated, identified, and retracted laterally with the sternocleidomastoid muscle. The strap muscles were identified and retracted medially and the pretracheal fascia was entered. A bent spinal needle was used with fluoroscopy to localize the surgical level after dissection. The longus colli were elevated bilaterally and a  self-retaining retractor was placed. The endotracheal tube cuff balloon was deflated and reinflated after retractor placement.   Anterior osteophytes were removed until flush with the anterior vertebral body. The disc annulus was incised and a complete C4-5 discectomy was performed. The posterior longitudinal ligament was incised followed by ligamentous and bony removal until no central canal stenosis was present. The PLL was densely adherent, thickened, and quite tough. I used a multitude of instruments including sharp nerve hooks and could not incise it without downward pressure, so I used an #11 blade to open it sharply and separate it from the dura, thankfully without a durotomy. Decompression was then taken out laterally into the bilateral foramina until no foraminal stenosis was palpable. A cortical allograft ([employer]tronic) was inserted into the disc space as an interbody graft.   The above technique was then repeated at C3-4 and C5-6 in the same fashion with, as expected from preop imaging, severe stenosis at both levels that improved after decompression. Similarly to the first level, the PLL was again very difficult to dissect and required sharp dissection off the dura.   An anterior plate (Medtronic) was then positioned and 8,  27mm screws were used to secure the plate to the C3, C4, C5 and C6 vertebral bodies. Hemostasis was obtained and the incision was closed in layers. All instrument and sponge counts were correct. The patient was then returned to anesthesia for emergence. No apparent complications at the completion of the procedure.   EBL:  78mL   DRAINS: none   SPECIMENS: none   Jadene Pierini, MD 09/01/21 9:39 AM

## 2021-09-01 NOTE — Anesthesia Procedure Notes (Signed)
Procedure Name: Intubation Date/Time: 09/01/2021 10:19 AM Performed by: Lovie Chol, CRNA Pre-anesthesia Checklist: Patient identified, Emergency Drugs available, Suction available and Patient being monitored Patient Re-evaluated:Patient Re-evaluated prior to induction Oxygen Delivery Method: Circle System Utilized Preoxygenation: Pre-oxygenation with 100% oxygen Induction Type: IV induction Ventilation: Mask ventilation without difficulty Laryngoscope Size: Glidescope and 3 Grade View: Grade I Tube type: Oral Tube size: 7.0 mm Number of attempts: 1 Airway Equipment and Method: Oral airway, Video-laryngoscopy and Rigid stylet Placement Confirmation: ETT inserted through vocal cords under direct vision, positive ETCO2 and breath sounds checked- equal and bilateral Secured at: 21 cm Tube secured with: Tape Dental Injury: Teeth and Oropharynx as per pre-operative assessment

## 2021-09-01 NOTE — Anesthesia Postprocedure Evaluation (Signed)
Anesthesia Post Note  Patient: Jamie Rollins  Procedure(s) Performed: Cervical three-four Cervical four-five Cervical five-six Anterior Cervical Decompression/discectomy Fusion (Spine Cervical)     Patient location during evaluation: PACU Anesthesia Type: General Level of consciousness: sedated Pain management: pain level controlled Vital Signs Assessment: post-procedure vital signs reviewed and stable Respiratory status: spontaneous breathing and respiratory function stable Cardiovascular status: stable Postop Assessment: no apparent nausea or vomiting Anesthetic complications: no   No notable events documented.  Last Vitals:  Vitals:   09/01/21 1515 09/01/21 1530  BP: 133/64 121/68  Pulse: 78 79  Resp: 11 14  Temp:    SpO2: 100% 92%    Last Pain:  Vitals:   09/01/21 1530  TempSrc:   PainSc: 2                  Jaylon Boylen DANIEL

## 2021-09-01 NOTE — Transfer of Care (Signed)
Immediate Anesthesia Transfer of Care Note  Patient: Jamie Rollins  Procedure(s) Performed: Cervical three-four Cervical four-five Cervical five-six Anterior Cervical Decompression/discectomy Fusion (Spine Cervical)  Patient Location: PACU  Anesthesia Type:General  Level of Consciousness: awake, oriented and patient cooperative  Airway & Oxygen Therapy: Patient Spontanous Breathing and Patient connected to nasal cannula oxygen  Post-op Assessment: Report given to RN and Post -op Vital signs reviewed and stable  Post vital signs: Reviewed  Last Vitals:  Vitals Value Taken Time  BP 155/77 09/01/21 1415  Temp    Pulse 88 09/01/21 1417  Resp 26 09/01/21 1417  SpO2 100 % 09/01/21 1417  Vitals shown include unvalidated device data.  Last Pain:  Vitals:   09/01/21 0839  TempSrc:   PainSc: 0-No pain         Complications: No notable events documented.

## 2021-09-01 NOTE — Progress Notes (Signed)
Orthopedic Tech Progress Note Patient Details:  Jamie Rollins 05-30-1941 [id number]  Applied earlier   Ortho Devices Type of Ortho Device: Soft collar Ortho Device/Splint Location: NECK Ortho Device/Splint Interventions: Ordered   Post Interventions Patient Tolerated: Well Instructions Provided: Care of device  Donald Pore 09/01/2021, 6:12 PM

## 2021-09-01 NOTE — H&P (Signed)
Surgical H&P Update  HPI: 81 y.o. with a history of progressive cervical myelopathy with BUE / BLE weakness / numbness, difficulty with fine motor movement in the BUE, worsening gait. Workup showed cervical stenosis with cord signal change. No changes in health since they were last seen. Still having the above and wishes to proceed with surgery.  PMHx:  Past Medical History:  Diagnosis Date   Anemia    Arthritis    COPD (chronic obstructive pulmonary disease) ([place])    Hypertension    FamHx:  Family History  Problem Relation Age of Onset   Breast cancer Neg Hx    SocHx:  reports that she quit smoking about 7 years ago. Her smoking use included cigarettes. She has never used smokeless tobacco. She reports that she does not drink alcohol and does not use drugs.  Physical Exam: Strength 4/5 in BLE, 4 to 4+/5 in BUE, +b/l stocking-glove numbness, +hoffman's bilaterally, reflexes 3+ x4  Assesment/Plan: 81 y.o. woman with cervical myelopathy with cord signal change, here for 3 level ACDF. Risks, benefits, and alternatives discussed and the patient would like to continue with surgery.  -OR today -3C post-op  Judith Part, MD 09/01/21 9:36 AM

## 2021-09-02 MED ORDER — OXYCODONE HCL 5 MG PO TABS: 5.0000 mg | ORAL_TABLET | ORAL | 0 refills | Status: AC | PRN

## 2021-09-02 NOTE — Progress Notes (Signed)
Patient alert and oriented, mae's well, voiding adequate amount of urine, swallowing without difficulty, no c/o pain at time of discharge. Patient discharged home with family. Script and discharged instructions given to patient. Patient and family stated understanding of instructions given. Patient has an appointment with Dr. Ostergard   

## 2021-09-02 NOTE — Evaluation (Signed)
Occupational Therapy Evaluation Patient Details Name: Jamie Rollins MRN: [id number] DOB: May 06, 1941 Today's Date: 09/02/2021   History of Present Illness 81 yo female s/p ACDF C3-6 on 09/01/21 PMH COPD HTN arthritis R THA (02/2019)   Clinical Impression   Patient is s/p ACDF surgery resulting in functional limitations due to the deficits listed below (see OT problem list). Pt noted to have decreased fine motor and provided hand exercise program for 3x per day. Recommendation for OT outpatient for fine motor deficits. Pt progressing well with bed toilet and stair transfer this session. Pt will have 24/7 (A) between 2 sisters and spouse.  Patient will benefit from skilled OT acutely to increase independence and safety with ADLS to allow discharge outpatient OT.       Recommendations for follow up therapy are one component of a multi-disciplinary discharge planning process, led by the attending physician.  Recommendations may be updated based on patient status, additional functional criteria and insurance authorization.   Follow Up Recommendations  Outpatient OT    Assistance Recommended at Discharge Set up Supervision/Assistance  Patient can return home with the following      Functional Status Assessment  Patient has had a recent decline in their functional status and demonstrates the ability to make significant improvements in function in a reasonable and predictable amount of time.  Equipment Recommendations  None recommended by OT    Recommendations for Other Services       Precautions / Restrictions Precautions Precautions: Cervical Precaution Comments: provided cervical precautions Required Braces or Orthoses: Cervical Brace Cervical Brace: Soft collar;For comfort      Mobility Bed Mobility Overal bed mobility: Needs Assistance Bed Mobility: Supine to Sit, Sit to Supine     Supine to sit: Min assist, HOB elevated Sit to supine: Min assist, HOB elevated   General bed  mobility comments: pt requires mod cues for sequence to complete task. pt requires (A) to elevate off bed surface and to bring bil LE onto the bed surface    Transfers Overall transfer level: Needs assistance Equipment used: Rolling walker (2 wheels) Transfers: Sit to/from Stand Sit to Stand: Min guard           General transfer comment: requires use of bil UE      Balance Overall balance assessment: Mild deficits observed, not formally tested                                         ADL either performed or assessed with clinical judgement   ADL Overall ADL's : Needs assistance/impaired Eating/Feeding: Set up;Bed level   Grooming: Wash/dry face;Oral care;Min guard;Standing Grooming Details (indicate cue type and reason): completed oral care. pt forgetting initally to brush teeth only dentures. pt states "oh shoot and then returning to task"         Upper Body Dressing : Minimal assistance;Sitting Upper Body Dressing Details (indicate cue type and reason): requires (A) with bra Lower Body Dressing: Minimal assistance;Sit to/from stand Lower Body Dressing Details (indicate cue type and reason): requires (A) threading bil LE into the underwear/ pants. pt able to pull up pants once started. pt reports having reacher at home Toilet Transfer: Min guard;Ambulation;Regular Toilet;Rolling walker (2 wheels)           Functional mobility during ADLs: Min guard;Rolling walker (2 wheels) General ADL Comments: pt completed bed mobility, basic bathroom transfer, stair  transfer and positioning discussion for chair/ bed     Vision Baseline Vision/History: 1 Wears glasses Ability to See in Adequate Light: 0 Adequate Additional Comments: pt states my glasses are in my shoes. OT cueing patient that glasses are on her face     Perception     Praxis      Pertinent Vitals/Pain Pain Assessment Pain Assessment: No/denies pain     Hand Dominance Right    Extremity/Trunk Assessment Upper Extremity Assessment Upper Extremity Assessment: RUE deficits/detail;LUE deficits/detail RUE Deficits / Details: 4 out 5 MMT shoulder flexion RUE Sensation: decreased light touch RUE Coordination: decreased fine motor;decreased gross motor LUE Deficits / Details: 4 out 5 MMT shoulder flexion LUE Coordination: decreased fine motor;decreased gross motor   Lower Extremity Assessment Lower Extremity Assessment: Defer to PT evaluation   Cervical / Trunk Assessment Cervical / Trunk Assessment: Kyphotic;Neck Surgery   Communication Communication Communication: No difficulties   Cognition Arousal/Alertness: Awake/alert Behavior During Therapy: Flat affect Overall Cognitive Status: Impaired/Different from baseline Area of Impairment: Attention, Memory, Following commands, Safety/judgement, Awareness, Problem solving                   Current Attention Level: Sustained Memory: Decreased recall of precautions, Decreased short-term memory Following Commands: Follows one step commands with increased time Safety/Judgement: Decreased awareness of safety, Decreased awareness of deficits Awareness: Emergent Problem Solving: Slow processing, Difficulty sequencing General Comments: pt reports having decreased memory currently. pt needs teach back with key points in session. sister present and handout provided for all education to help with recall after d/c. Pt reports "ill just ask her" and OT repeating education with stronger teach back to make sure that hearing is not a factor in the obtaining information     General Comments  VSS. pt reports feeling "tired"    Exercises     Shoulder Instructions      Home Living Family/patient expects to be discharged to:: Private residence Living Arrangements: Spouse/significant other Available Help at Discharge: Family Type of Home: House Home Access: Stairs to enter Secretary/administrator of Steps: 4 Entrance  Stairs-Rails: Can reach both Home Layout: One level     Bathroom Shower/Tub: Chief Strategy Officer: Handicapped height     Home Equipment: Cane - single Librarian, academic (2 wheels);Grab bars - tub/shower;Hand held shower head   Additional Comments: lives with spouse that works during the day. Plans to d/c to sister home initially to have 24/7 (A).Pt has a dog (Bogie) cat Pamelia Hoit) 78 yo bird named [place]. PT has a recliner at her home.  Sisters house has 5-6 stairs, recliner for sleeping, tub shower combo one level home      Prior Functioning/Environment Prior Level of Function : Needs assist             Mobility Comments: using RW ADLs Comments: Needs (A) with bra hook and LB dressing from spouse        OT Problem List: Decreased activity tolerance;Impaired balance (sitting and/or standing);Decreased knowledge of use of DME or AE;Decreased knowledge of precautions;Impaired UE functional use;Decreased cognition      OT Treatment/Interventions: Self-care/ADL training;Therapeutic exercise;DME and/or AE instruction;Energy conservation;Therapeutic activities;Patient/family education;Balance training;Cognitive remediation/compensation    OT Goals(Current goals can be found in the care plan section) Acute Rehab OT Goals Patient Stated Goal: to return to driving in the future OT Goal Formulation: With patient Time For Goal Achievement: 09/16/21 Potential to Achieve Goals: Good  OT Frequency: Min 2X/week  Co-evaluation              AM-PAC OT "6 Clicks" Daily Activity     Outcome Measure Help from another person eating meals?: A Little Help from another person taking care of personal grooming?: A Little Help from another person toileting, which includes using toliet, bedpan, or urinal?: A Little Help from another person bathing (including washing, rinsing, drying)?: A Little Help from another person to put on and taking off regular upper body clothing?: A  Little Help from another person to put on and taking off regular lower body clothing?: A Little 6 Click Score: 18   End of Session Equipment Utilized During Treatment: Rolling walker (2 wheels) Nurse Communication: Mobility status;Precautions  Activity Tolerance: Patient tolerated treatment well Patient left: in bed;with call bell/phone within reach;with family/visitor present  OT Visit Diagnosis: Unsteadiness on feet (R26.81);Muscle weakness (generalized) (M62.81)                Time: 9562-1308 OT Time Calculation (min): 45 min Charges:  OT General Charges $OT Visit: 1 Visit OT Evaluation $OT Eval Moderate Complexity: 1 Mod OT Treatments $Self Care/Home Management : 23-37 mins   Brynn, OTR/L  Acute Rehabilitation Services Pager: [phone] Office: [phone] .   Mateo Flow 09/02/2021, 10:11 AM

## 2021-09-02 NOTE — Progress Notes (Signed)
Neurosurgery Service Progress Note  Subjective: No acute events overnight, did well with breakfast this morning, neck pain is minimal, doing well   Objective: Vitals:   09/01/21 1945 09/01/21 2314 09/02/21 0335 09/02/21 0709  BP: (!) 150/69 (!) 103/47 (!) 101/45 133/60  Pulse: 96 66 82 79  Resp: 18 18 18 18   Temp: 98.6 F (37 C) 97.7 F (36.5 C) 98.2 F (36.8 C) (!) 97.4 F (36.3 C)  TempSrc: Oral Oral Oral Oral  SpO2: 94% 94% 91% 95%  Weight:      Height:        Physical Exam: Strength 4+/5 in BUE, 4+/5 in BLE, reflexes 3+, +b/l hoffman's, incision c/d/i  Assessment & Plan: 81 y.o. woman s/p 3 level ACDF, recovering well.  -discharge home today  72  09/02/21 7:46 AM

## 2021-09-02 NOTE — Progress Notes (Signed)
Pharmacy - BP medication clarification  Spoke with the patient via phone after discharge. Clarified that she is only taking lisinopril and is NOT taking telmisartan. I updated the PTA medication list.  Thank you for involving pharmacy in this patient's care.  Loura Back, PharmD, BCPS Clinical Pharmacist Clinical phone for 09/02/2021 until 3p is [phone] 09/02/2021 12:57 PM  **Pharmacist phone directory can be found on [email or web address] listed under [facility]**'

## 2021-09-02 NOTE — Plan of Care (Signed)
Adequately Ready for Discharge 

## 2021-09-02 NOTE — Discharge Summary (Signed)
Discharge Summary  Date of Admission: 09/01/2021  Date of Discharge: 09/02/21  Attending Physician: Autumn Patty, MD  Hospital Course: Patient was admitted following an uncomplicated 3 level ACDF. They were recovered in PACU and transferred to [facility]. Post-op, she did well with no new issues, her hospital course was uncomplicated and the patient was discharged home on 09/02/21. They will follow up in clinic with me in clinic in 2 weeks.  Neurologic exam at discharge:  Strength 4+/5 in BUE, 4/5 in BLE, reflexes 3+, +b/l hoffman's  Discharge diagnosis: Cervical myelopathy  Jadene Pierini, MD 09/02/21 7:48 AM

## 2021-09-02 NOTE — Progress Notes (Signed)
PT Cancellation Note  Patient Details Name: GENICE KIMBERLIN MRN: [id number] DOB: October 28, 1940   Cancelled Treatment:    Reason Eval/Treat Not Completed: PT screened, no needs identified, will sign off - per OT, no PT needs at this time. Will sign off, please reconsult if needed.  Marye Round, PT DPT Acute Rehabilitation Services Pager [phone]  Office [phone]    Truddie Coco 09/02/2021, 9:17 AM

## 2021-09-04 ENCOUNTER — Encounter (HOSPITAL_COMMUNITY): Payer: Self-pay | Admitting: Neurological Surgery

## 2021-09-09 DIAGNOSIS — I10 Essential (primary) hypertension: Secondary | ICD-10-CM | POA: Diagnosis not present

## 2021-09-09 DIAGNOSIS — I25118 Atherosclerotic heart disease of native coronary artery with other forms of angina pectoris: Secondary | ICD-10-CM | POA: Diagnosis not present

## 2021-09-09 DIAGNOSIS — K219 Gastro-esophageal reflux disease without esophagitis: Secondary | ICD-10-CM | POA: Diagnosis not present

## 2021-09-09 DIAGNOSIS — Z87891 Personal history of nicotine dependence: Secondary | ICD-10-CM | POA: Diagnosis not present

## 2021-09-09 DIAGNOSIS — J449 Chronic obstructive pulmonary disease, unspecified: Secondary | ICD-10-CM | POA: Diagnosis not present

## 2021-09-09 DIAGNOSIS — Z299 Encounter for prophylactic measures, unspecified: Secondary | ICD-10-CM | POA: Diagnosis not present

## 2021-09-09 DIAGNOSIS — N183 Chronic kidney disease, stage 3 unspecified: Secondary | ICD-10-CM | POA: Diagnosis not present

## 2021-09-30 DIAGNOSIS — I10 Essential (primary) hypertension: Secondary | ICD-10-CM | POA: Diagnosis not present

## 2021-09-30 DIAGNOSIS — Z299 Encounter for prophylactic measures, unspecified: Secondary | ICD-10-CM | POA: Diagnosis not present

## 2021-09-30 DIAGNOSIS — I25118 Atherosclerotic heart disease of native coronary artery with other forms of angina pectoris: Secondary | ICD-10-CM | POA: Diagnosis not present

## 2021-09-30 DIAGNOSIS — M542 Cervicalgia: Secondary | ICD-10-CM | POA: Diagnosis not present

## 2021-10-10 DIAGNOSIS — R2681 Unsteadiness on feet: Secondary | ICD-10-CM | POA: Diagnosis not present

## 2021-10-10 DIAGNOSIS — Z9181 History of falling: Secondary | ICD-10-CM | POA: Diagnosis not present

## 2021-10-15 DIAGNOSIS — R2681 Unsteadiness on feet: Secondary | ICD-10-CM | POA: Diagnosis not present

## 2021-10-15 DIAGNOSIS — Z9181 History of falling: Secondary | ICD-10-CM | POA: Diagnosis not present

## 2021-10-16 DIAGNOSIS — Z9181 History of falling: Secondary | ICD-10-CM | POA: Diagnosis not present

## 2021-10-16 DIAGNOSIS — R2681 Unsteadiness on feet: Secondary | ICD-10-CM | POA: Diagnosis not present

## 2021-10-21 DIAGNOSIS — Z9181 History of falling: Secondary | ICD-10-CM | POA: Diagnosis not present

## 2021-10-21 DIAGNOSIS — R2681 Unsteadiness on feet: Secondary | ICD-10-CM | POA: Diagnosis not present

## 2021-10-23 DIAGNOSIS — R2681 Unsteadiness on feet: Secondary | ICD-10-CM | POA: Diagnosis not present

## 2021-10-23 DIAGNOSIS — Z9181 History of falling: Secondary | ICD-10-CM | POA: Diagnosis not present

## 2021-10-28 DIAGNOSIS — Z9181 History of falling: Secondary | ICD-10-CM | POA: Diagnosis not present

## 2021-10-28 DIAGNOSIS — R2681 Unsteadiness on feet: Secondary | ICD-10-CM | POA: Diagnosis not present

## 2021-10-30 DIAGNOSIS — Z9181 History of falling: Secondary | ICD-10-CM | POA: Diagnosis not present

## 2021-10-30 DIAGNOSIS — R2681 Unsteadiness on feet: Secondary | ICD-10-CM | POA: Diagnosis not present

## 2021-11-04 DIAGNOSIS — Z9181 History of falling: Secondary | ICD-10-CM | POA: Diagnosis not present

## 2021-11-04 DIAGNOSIS — R2681 Unsteadiness on feet: Secondary | ICD-10-CM | POA: Diagnosis not present

## 2021-11-07 DIAGNOSIS — Z9181 History of falling: Secondary | ICD-10-CM | POA: Diagnosis not present

## 2021-11-07 DIAGNOSIS — R2681 Unsteadiness on feet: Secondary | ICD-10-CM | POA: Diagnosis not present

## 2021-11-10 DIAGNOSIS — Z0000 Encounter for general adult medical examination without abnormal findings: Secondary | ICD-10-CM | POA: Diagnosis not present

## 2021-11-10 DIAGNOSIS — I10 Essential (primary) hypertension: Secondary | ICD-10-CM | POA: Diagnosis not present

## 2021-11-10 DIAGNOSIS — Z7189 Other specified counseling: Secondary | ICD-10-CM | POA: Diagnosis not present

## 2021-11-10 DIAGNOSIS — Z299 Encounter for prophylactic measures, unspecified: Secondary | ICD-10-CM | POA: Diagnosis not present

## 2021-11-10 DIAGNOSIS — D692 Other nonthrombocytopenic purpura: Secondary | ICD-10-CM | POA: Diagnosis not present

## 2021-11-11 DIAGNOSIS — R2681 Unsteadiness on feet: Secondary | ICD-10-CM | POA: Diagnosis not present

## 2021-11-11 DIAGNOSIS — Z9181 History of falling: Secondary | ICD-10-CM | POA: Diagnosis not present

## 2021-11-13 DIAGNOSIS — Z9181 History of falling: Secondary | ICD-10-CM | POA: Diagnosis not present

## 2021-11-13 DIAGNOSIS — R2681 Unsteadiness on feet: Secondary | ICD-10-CM | POA: Diagnosis not present

## 2021-11-18 DIAGNOSIS — Z9181 History of falling: Secondary | ICD-10-CM | POA: Diagnosis not present

## 2021-11-18 DIAGNOSIS — R2681 Unsteadiness on feet: Secondary | ICD-10-CM | POA: Diagnosis not present

## 2021-11-20 DIAGNOSIS — R2681 Unsteadiness on feet: Secondary | ICD-10-CM | POA: Diagnosis not present

## 2021-11-20 DIAGNOSIS — Z9181 History of falling: Secondary | ICD-10-CM | POA: Diagnosis not present

## 2021-11-25 DIAGNOSIS — Z9181 History of falling: Secondary | ICD-10-CM | POA: Diagnosis not present

## 2021-11-25 DIAGNOSIS — R2681 Unsteadiness on feet: Secondary | ICD-10-CM | POA: Diagnosis not present

## 2021-11-27 DIAGNOSIS — R2681 Unsteadiness on feet: Secondary | ICD-10-CM | POA: Diagnosis not present

## 2021-11-27 DIAGNOSIS — Z9181 History of falling: Secondary | ICD-10-CM | POA: Diagnosis not present

## 2021-12-02 DIAGNOSIS — R2681 Unsteadiness on feet: Secondary | ICD-10-CM | POA: Diagnosis not present

## 2021-12-02 DIAGNOSIS — Z9181 History of falling: Secondary | ICD-10-CM | POA: Diagnosis not present

## 2021-12-04 DIAGNOSIS — Z9181 History of falling: Secondary | ICD-10-CM | POA: Diagnosis not present

## 2021-12-04 DIAGNOSIS — R2681 Unsteadiness on feet: Secondary | ICD-10-CM | POA: Diagnosis not present

## 2021-12-09 DIAGNOSIS — R2681 Unsteadiness on feet: Secondary | ICD-10-CM | POA: Diagnosis not present

## 2021-12-09 DIAGNOSIS — Z9181 History of falling: Secondary | ICD-10-CM | POA: Diagnosis not present

## 2021-12-11 DIAGNOSIS — R2681 Unsteadiness on feet: Secondary | ICD-10-CM | POA: Diagnosis not present

## 2021-12-11 DIAGNOSIS — Z9181 History of falling: Secondary | ICD-10-CM | POA: Diagnosis not present

## 2021-12-16 DIAGNOSIS — Z9181 History of falling: Secondary | ICD-10-CM | POA: Diagnosis not present

## 2021-12-16 DIAGNOSIS — R2681 Unsteadiness on feet: Secondary | ICD-10-CM | POA: Diagnosis not present

## 2021-12-18 DIAGNOSIS — Z9181 History of falling: Secondary | ICD-10-CM | POA: Diagnosis not present

## 2021-12-18 DIAGNOSIS — R2681 Unsteadiness on feet: Secondary | ICD-10-CM | POA: Diagnosis not present

## 2021-12-23 DIAGNOSIS — R2681 Unsteadiness on feet: Secondary | ICD-10-CM | POA: Diagnosis not present

## 2021-12-23 DIAGNOSIS — Z9181 History of falling: Secondary | ICD-10-CM | POA: Diagnosis not present

## 2021-12-25 DIAGNOSIS — Z9181 History of falling: Secondary | ICD-10-CM | POA: Diagnosis not present

## 2021-12-25 DIAGNOSIS — R2681 Unsteadiness on feet: Secondary | ICD-10-CM | POA: Diagnosis not present

## 2021-12-30 DIAGNOSIS — R2681 Unsteadiness on feet: Secondary | ICD-10-CM | POA: Diagnosis not present

## 2021-12-30 DIAGNOSIS — Z9181 History of falling: Secondary | ICD-10-CM | POA: Diagnosis not present

## 2022-01-06 DIAGNOSIS — R2681 Unsteadiness on feet: Secondary | ICD-10-CM | POA: Diagnosis not present

## 2022-01-06 DIAGNOSIS — Z9181 History of falling: Secondary | ICD-10-CM | POA: Diagnosis not present

## 2022-01-08 DIAGNOSIS — Z9181 History of falling: Secondary | ICD-10-CM | POA: Diagnosis not present

## 2022-01-08 DIAGNOSIS — R2681 Unsteadiness on feet: Secondary | ICD-10-CM | POA: Diagnosis not present

## 2022-01-09 DIAGNOSIS — K219 Gastro-esophageal reflux disease without esophagitis: Secondary | ICD-10-CM | POA: Diagnosis not present

## 2022-01-09 DIAGNOSIS — I10 Essential (primary) hypertension: Secondary | ICD-10-CM | POA: Diagnosis not present

## 2022-01-20 DIAGNOSIS — Z9181 History of falling: Secondary | ICD-10-CM | POA: Diagnosis not present

## 2022-01-20 DIAGNOSIS — R2681 Unsteadiness on feet: Secondary | ICD-10-CM | POA: Diagnosis not present

## 2022-01-22 DIAGNOSIS — R2681 Unsteadiness on feet: Secondary | ICD-10-CM | POA: Diagnosis not present

## 2022-01-22 DIAGNOSIS — Z9181 History of falling: Secondary | ICD-10-CM | POA: Diagnosis not present

## 2022-01-27 DIAGNOSIS — R2681 Unsteadiness on feet: Secondary | ICD-10-CM | POA: Diagnosis not present

## 2022-01-27 DIAGNOSIS — Z9181 History of falling: Secondary | ICD-10-CM | POA: Diagnosis not present

## 2022-01-29 DIAGNOSIS — Z9181 History of falling: Secondary | ICD-10-CM | POA: Diagnosis not present

## 2022-01-29 DIAGNOSIS — R2681 Unsteadiness on feet: Secondary | ICD-10-CM | POA: Diagnosis not present

## 2022-02-03 DIAGNOSIS — Z9181 History of falling: Secondary | ICD-10-CM | POA: Diagnosis not present

## 2022-02-03 DIAGNOSIS — R2681 Unsteadiness on feet: Secondary | ICD-10-CM | POA: Diagnosis not present

## 2022-02-05 DIAGNOSIS — R2681 Unsteadiness on feet: Secondary | ICD-10-CM | POA: Diagnosis not present

## 2022-02-05 DIAGNOSIS — Z9181 History of falling: Secondary | ICD-10-CM | POA: Diagnosis not present

## 2022-02-10 DIAGNOSIS — I2510 Atherosclerotic heart disease of native coronary artery without angina pectoris: Secondary | ICD-10-CM | POA: Diagnosis not present

## 2022-02-10 DIAGNOSIS — Z9181 History of falling: Secondary | ICD-10-CM | POA: Diagnosis not present

## 2022-02-10 DIAGNOSIS — E785 Hyperlipidemia, unspecified: Secondary | ICD-10-CM | POA: Diagnosis not present

## 2022-02-10 DIAGNOSIS — Z87891 Personal history of nicotine dependence: Secondary | ICD-10-CM | POA: Diagnosis not present

## 2022-02-10 DIAGNOSIS — J449 Chronic obstructive pulmonary disease, unspecified: Secondary | ICD-10-CM | POA: Diagnosis not present

## 2022-02-10 DIAGNOSIS — T63444A Toxic effect of venom of bees, undetermined, initial encounter: Secondary | ICD-10-CM | POA: Diagnosis not present

## 2022-02-10 DIAGNOSIS — I10 Essential (primary) hypertension: Secondary | ICD-10-CM | POA: Diagnosis not present

## 2022-02-10 DIAGNOSIS — T63441A Toxic effect of venom of bees, accidental (unintentional), initial encounter: Secondary | ICD-10-CM | POA: Diagnosis not present

## 2022-02-10 DIAGNOSIS — Z96641 Presence of right artificial hip joint: Secondary | ICD-10-CM | POA: Diagnosis not present

## 2022-02-10 DIAGNOSIS — K219 Gastro-esophageal reflux disease without esophagitis: Secondary | ICD-10-CM | POA: Diagnosis not present

## 2022-02-10 DIAGNOSIS — R2681 Unsteadiness on feet: Secondary | ICD-10-CM | POA: Diagnosis not present

## 2022-02-13 DIAGNOSIS — I10 Essential (primary) hypertension: Secondary | ICD-10-CM | POA: Diagnosis not present

## 2022-02-13 DIAGNOSIS — Z789 Other specified health status: Secondary | ICD-10-CM | POA: Diagnosis not present

## 2022-02-13 DIAGNOSIS — J449 Chronic obstructive pulmonary disease, unspecified: Secondary | ICD-10-CM | POA: Diagnosis not present

## 2022-02-13 DIAGNOSIS — T63441A Toxic effect of venom of bees, accidental (unintentional), initial encounter: Secondary | ICD-10-CM | POA: Diagnosis not present

## 2022-02-13 DIAGNOSIS — Z299 Encounter for prophylactic measures, unspecified: Secondary | ICD-10-CM | POA: Diagnosis not present

## 2022-02-17 DIAGNOSIS — Z9181 History of falling: Secondary | ICD-10-CM | POA: Diagnosis not present

## 2022-02-17 DIAGNOSIS — R2681 Unsteadiness on feet: Secondary | ICD-10-CM | POA: Diagnosis not present

## 2022-03-10 DIAGNOSIS — I2510 Atherosclerotic heart disease of native coronary artery without angina pectoris: Secondary | ICD-10-CM | POA: Diagnosis not present

## 2022-03-10 DIAGNOSIS — E782 Mixed hyperlipidemia: Secondary | ICD-10-CM | POA: Diagnosis not present

## 2022-03-10 DIAGNOSIS — I10 Essential (primary) hypertension: Secondary | ICD-10-CM | POA: Diagnosis not present

## 2022-03-13 DIAGNOSIS — E7800 Pure hypercholesterolemia, unspecified: Secondary | ICD-10-CM | POA: Diagnosis not present

## 2022-03-13 DIAGNOSIS — E559 Vitamin D deficiency, unspecified: Secondary | ICD-10-CM | POA: Diagnosis not present

## 2022-03-13 DIAGNOSIS — Z79899 Other long term (current) drug therapy: Secondary | ICD-10-CM | POA: Diagnosis not present

## 2022-03-13 DIAGNOSIS — Z299 Encounter for prophylactic measures, unspecified: Secondary | ICD-10-CM | POA: Diagnosis not present

## 2022-03-13 DIAGNOSIS — Z0000 Encounter for general adult medical examination without abnormal findings: Secondary | ICD-10-CM | POA: Diagnosis not present

## 2022-03-13 DIAGNOSIS — R5383 Other fatigue: Secondary | ICD-10-CM | POA: Diagnosis not present

## 2022-03-13 DIAGNOSIS — I10 Essential (primary) hypertension: Secondary | ICD-10-CM | POA: Diagnosis not present

## 2022-03-23 DIAGNOSIS — Z23 Encounter for immunization: Secondary | ICD-10-CM | POA: Diagnosis not present

## 2022-07-16 IMAGING — MR MR CERVICAL SPINE W/O CM
5 series · 34 of 48 positions shown · non-contrast
Comparison: None.

CLINICAL DATA: Neck pain and hand numbness for 1 year.

EXAM:
MRI CERVICAL SPINE WITHOUT CONTRAST
TECHNIQUE: Multiplanar, multisequence MR imaging of the cervical spine was
performed. No intravenous contrast was administered.

[Series 2: T2 · sagittal · 3.0mm · 0.41mm/px · 8 of 15 slices shown (1 of 2)]
[im 1/15]
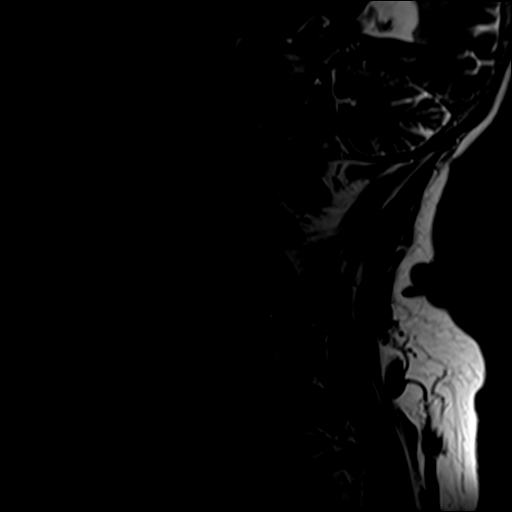
[im 3/15]
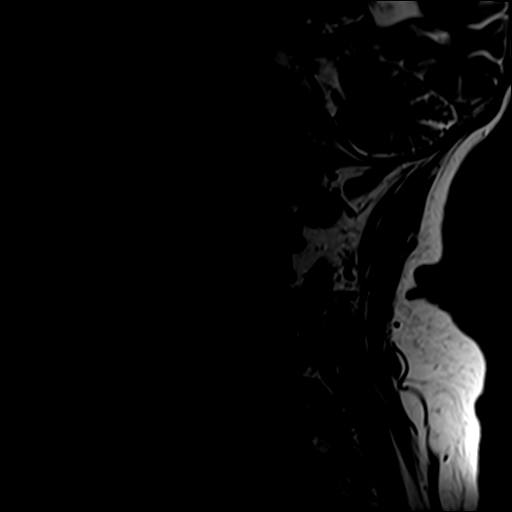
[im 5/15]
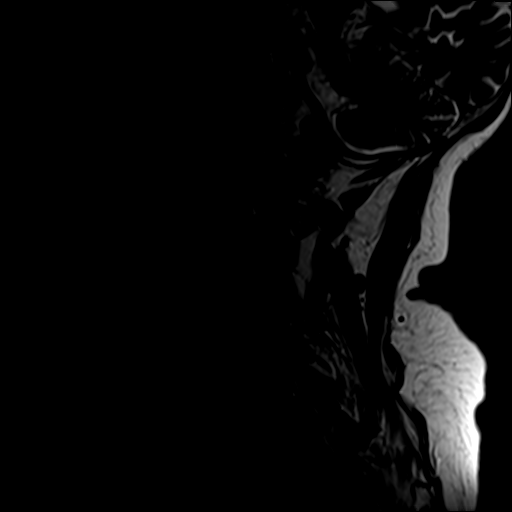
[im 7/15]
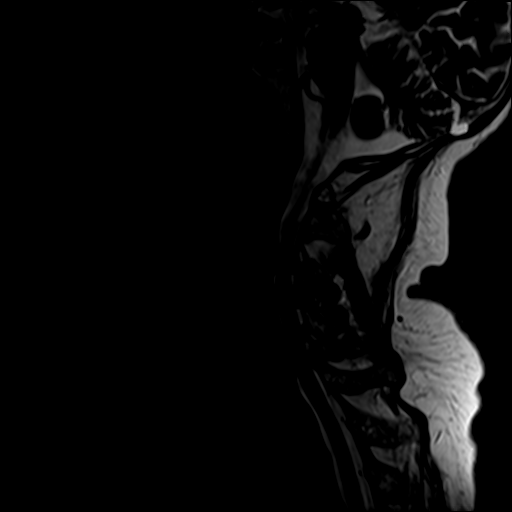
[im 9/15]
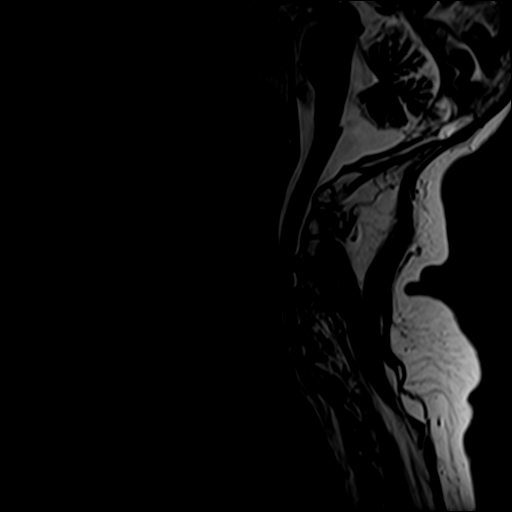
[im 11/15]
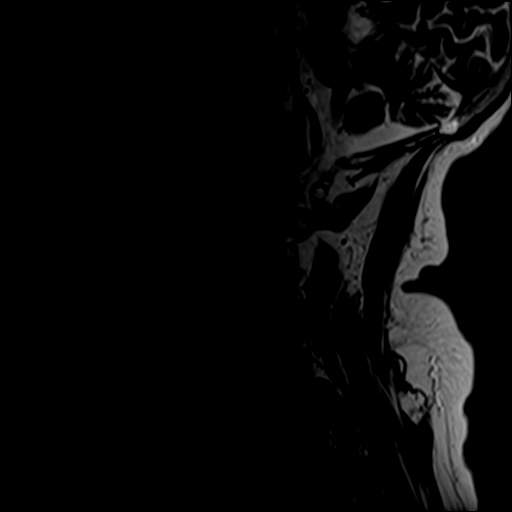
[im 13/15]
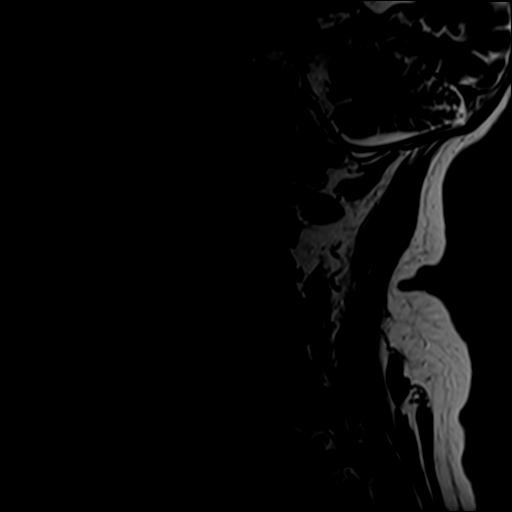
[im 15/15]
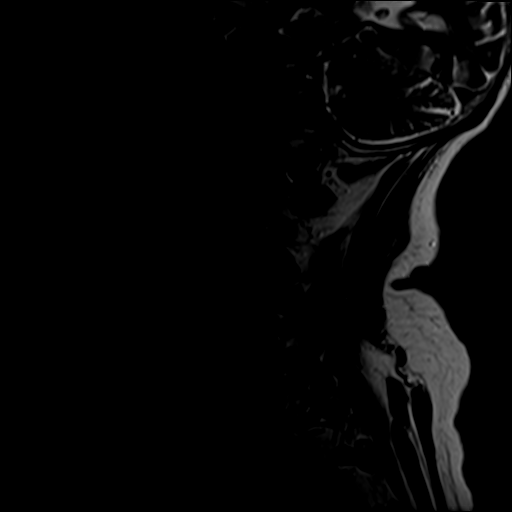

[Series 3: STIR · sagittal · 3.0mm · 0.82mm/px · 7 of 15 slices shown]
[im 1/15]
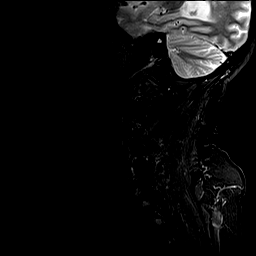
[im 3/15]
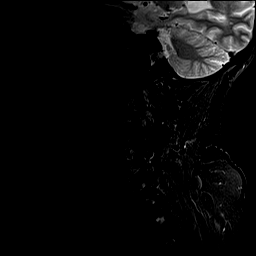
[im 5/15]
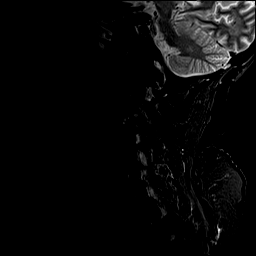
[im 8/15]
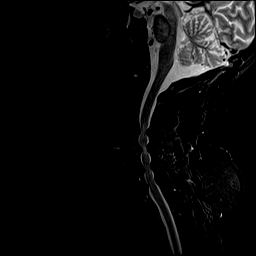
[im 10/15]
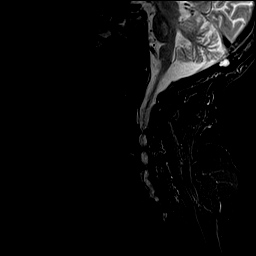
[im 12/15]
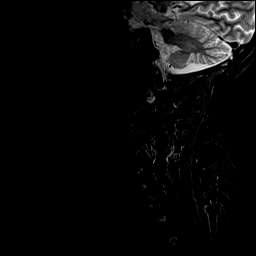
[im 15/15]
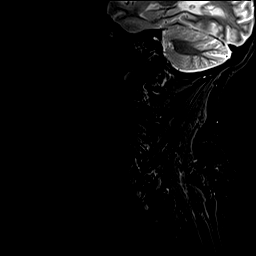

[Series 4: T1 · sagittal · 3.0mm · 0.82mm/px · 7 of 15 slices shown]
[im 1/15]
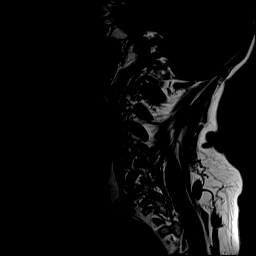
[im 3/15]
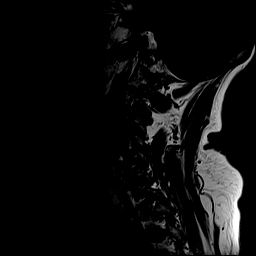
[im 5/15]
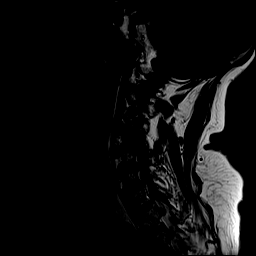
[im 8/15]
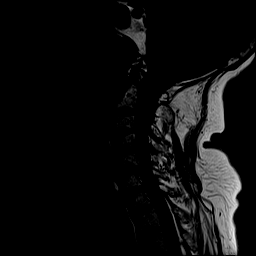
[im 10/15]
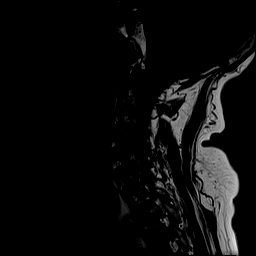
[im 12/15]
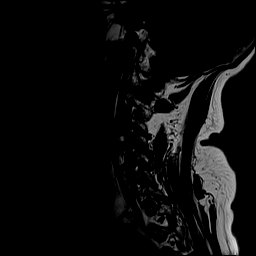
[im 15/15]
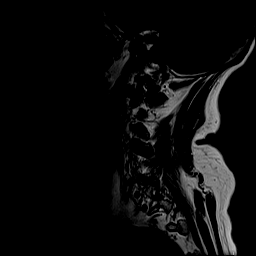

[Series 5: T2 · axial · 3.0mm · 0.70mm/px · z∈[-97,+0]mm · 9 of 28 slices shown (2 of 2)]
[im 1/28]
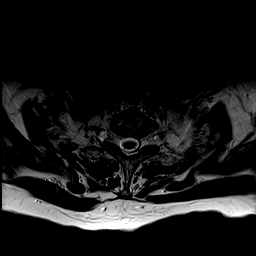
[im 5/28]
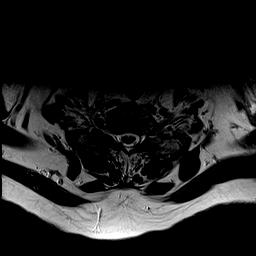
[im 10/28]
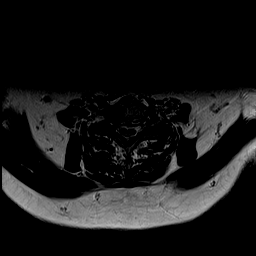
[im 12/28]
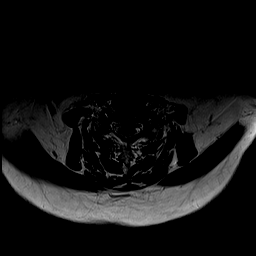
[im 14/28]
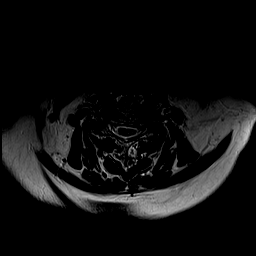
[im 16/28]
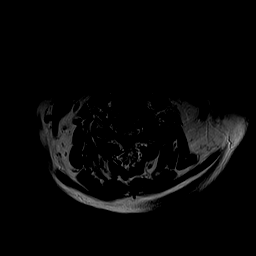
[im 19/28]
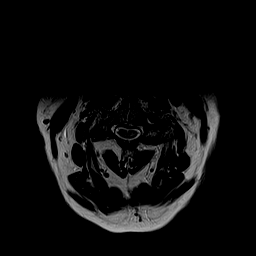
[im 23/28]
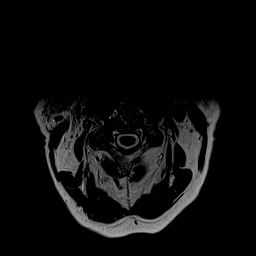
[im 28/28]
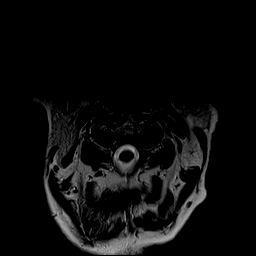

[Series 6: GRE · axial · 3.0mm · 0.35mm/px · z∈[-97,-65]mm · 3 of 28 slices shown]
[im 1/28]
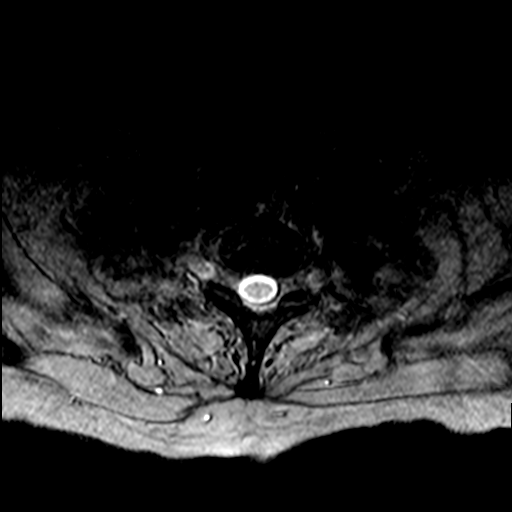
[im 5/28]
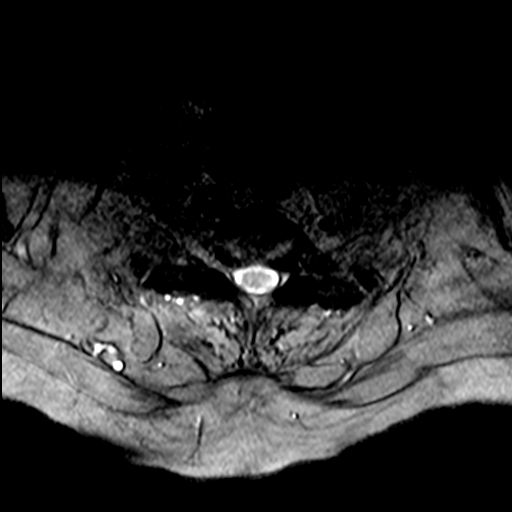
[im 10/28]
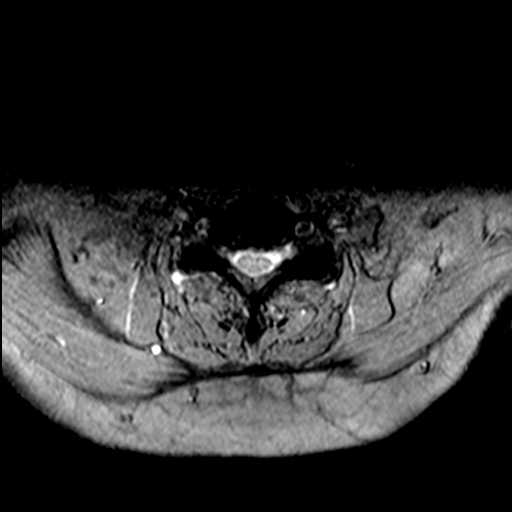

[34 of 48 positions shown; findings below may reference images not displayed]

FINDINGS: Alignment: 2 mm retrolisthesis of C5 on C6.

Vertebrae: No acute fracture, evidence of discitis, or bone lesion.

Cord: Focal compression of the spinal cord at C3-4 and C4-5 with
associated cord edema.

Posterior Fossa, vertebral arteries, paraspinal tissues: Posterior
fossa demonstrates no focal abnormality. Vertebral artery flow voids
are maintained. Paraspinal soft tissues are unremarkable.

Disc levels:

Discs: Degenerative disease with disc height loss at C3-4, C4-5,
C5-6 and C6-7.

C2-3: No significant disc bulge. Right uncovertebral degenerative
changes. Moderate right facet arthropathy. Moderate-severe right
foraminal stenosis. No left foraminal stenosis. No spinal stenosis.

C3-4: Broad-based disc bulge flattening ventral thecal sac. Moderate
left and mild right facet arthropathy. Bilateral uncovertebral
degenerative changes. Moderate right foraminal stenosis. Severe left
foraminal stenosis. Severe spinal stenosis.

C4-5: Broad-based disc bulge. Severe left and mild right facet
arthropathy. Severe spinal stenosis. Severe bilateral foraminal
stenosis.

C5-6: Broad-based disc osteophyte complex. Moderate bilateral facet
arthropathy. Bilateral uncovertebral degenerative changes. Moderate
spinal stenosis. Severe bilateral foraminal stenosis.

C6-7: Mild broad-based disc bulge. Moderate bilateral facet
arthropathy. Mild left foraminal stenosis. Moderate right foraminal
stenosis. Mild spinal stenosis.

C7-T1: No significant disc bulge. No neural foraminal stenosis. No
central canal stenosis.
IMPRESSION: 1. Focal compression of the spinal cord at C3-4 and C4-5 with
associated cord edema.
2. At C3-4 there is a broad-based disc bulge flattening ventral
thecal sac. Moderate left and mild right facet arthropathy.
Bilateral uncovertebral degenerative changes. Moderate right
foraminal stenosis. Severe left foraminal stenosis. Severe spinal
stenosis.
3. At C4-5 there is a broad-based disc bulge. Severe left and mild
right facet arthropathy. Severe spinal stenosis. Severe bilateral
foraminal stenosis.
4. At C5-6 there is a broad-based disc osteophyte complex. Moderate
bilateral facet arthropathy. Bilateral uncovertebral degenerative
changes. Moderate spinal stenosis.
5. At C6-7 there is a mild broad-based disc bulge. Mild left
foraminal stenosis. Moderate right foraminal stenosis. Mild spinal
stenosis.

## 2022-07-28 DIAGNOSIS — N183 Chronic kidney disease, stage 3 unspecified: Secondary | ICD-10-CM | POA: Diagnosis not present

## 2022-07-28 DIAGNOSIS — D692 Other nonthrombocytopenic purpura: Secondary | ICD-10-CM | POA: Diagnosis not present

## 2022-07-28 DIAGNOSIS — U071 COVID-19: Secondary | ICD-10-CM | POA: Diagnosis not present

## 2022-07-28 DIAGNOSIS — J449 Chronic obstructive pulmonary disease, unspecified: Secondary | ICD-10-CM | POA: Diagnosis not present

## 2022-09-07 DIAGNOSIS — Z299 Encounter for prophylactic measures, unspecified: Secondary | ICD-10-CM | POA: Diagnosis not present

## 2022-09-07 DIAGNOSIS — I25118 Atherosclerotic heart disease of native coronary artery with other forms of angina pectoris: Secondary | ICD-10-CM | POA: Diagnosis not present

## 2022-09-07 DIAGNOSIS — I10 Essential (primary) hypertension: Secondary | ICD-10-CM | POA: Diagnosis not present

## 2022-09-07 DIAGNOSIS — K5900 Constipation, unspecified: Secondary | ICD-10-CM | POA: Diagnosis not present

## 2022-09-07 DIAGNOSIS — I739 Peripheral vascular disease, unspecified: Secondary | ICD-10-CM | POA: Diagnosis not present

## 2022-09-14 DIAGNOSIS — D509 Iron deficiency anemia, unspecified: Secondary | ICD-10-CM | POA: Diagnosis not present

## 2022-09-14 DIAGNOSIS — Z299 Encounter for prophylactic measures, unspecified: Secondary | ICD-10-CM | POA: Diagnosis not present

## 2022-09-14 DIAGNOSIS — R5383 Other fatigue: Secondary | ICD-10-CM | POA: Diagnosis not present

## 2022-09-14 DIAGNOSIS — E559 Vitamin D deficiency, unspecified: Secondary | ICD-10-CM | POA: Diagnosis not present

## 2022-09-14 DIAGNOSIS — E538 Deficiency of other specified B group vitamins: Secondary | ICD-10-CM | POA: Diagnosis not present

## 2022-09-14 DIAGNOSIS — I10 Essential (primary) hypertension: Secondary | ICD-10-CM | POA: Diagnosis not present

## 2022-09-16 DIAGNOSIS — R413 Other amnesia: Secondary | ICD-10-CM | POA: Diagnosis not present

## 2022-10-02 DIAGNOSIS — Z299 Encounter for prophylactic measures, unspecified: Secondary | ICD-10-CM | POA: Diagnosis not present

## 2022-10-02 DIAGNOSIS — I10 Essential (primary) hypertension: Secondary | ICD-10-CM | POA: Diagnosis not present

## 2022-10-02 DIAGNOSIS — G4733 Obstructive sleep apnea (adult) (pediatric): Secondary | ICD-10-CM | POA: Diagnosis not present

## 2022-10-02 DIAGNOSIS — G4700 Insomnia, unspecified: Secondary | ICD-10-CM | POA: Diagnosis not present

## 2022-10-02 DIAGNOSIS — R0683 Snoring: Secondary | ICD-10-CM | POA: Diagnosis not present

## 2022-10-02 DIAGNOSIS — R413 Other amnesia: Secondary | ICD-10-CM | POA: Diagnosis not present

## 2022-11-09 DIAGNOSIS — D225 Melanocytic nevi of trunk: Secondary | ICD-10-CM | POA: Diagnosis not present

## 2022-12-23 DIAGNOSIS — Z79899 Other long term (current) drug therapy: Secondary | ICD-10-CM | POA: Diagnosis not present

## 2022-12-23 DIAGNOSIS — Z0000 Encounter for general adult medical examination without abnormal findings: Secondary | ICD-10-CM | POA: Diagnosis not present

## 2022-12-23 DIAGNOSIS — N183 Chronic kidney disease, stage 3 unspecified: Secondary | ICD-10-CM | POA: Diagnosis not present

## 2022-12-23 DIAGNOSIS — J449 Chronic obstructive pulmonary disease, unspecified: Secondary | ICD-10-CM | POA: Diagnosis not present

## 2022-12-23 DIAGNOSIS — Z299 Encounter for prophylactic measures, unspecified: Secondary | ICD-10-CM | POA: Diagnosis not present

## 2022-12-23 DIAGNOSIS — E559 Vitamin D deficiency, unspecified: Secondary | ICD-10-CM | POA: Diagnosis not present

## 2022-12-23 DIAGNOSIS — I25118 Atherosclerotic heart disease of native coronary artery with other forms of angina pectoris: Secondary | ICD-10-CM | POA: Diagnosis not present

## 2022-12-23 DIAGNOSIS — I10 Essential (primary) hypertension: Secondary | ICD-10-CM | POA: Diagnosis not present

## 2022-12-23 DIAGNOSIS — R5383 Other fatigue: Secondary | ICD-10-CM | POA: Diagnosis not present

## 2022-12-23 DIAGNOSIS — E7800 Pure hypercholesterolemia, unspecified: Secondary | ICD-10-CM | POA: Diagnosis not present

## 2022-12-31 DIAGNOSIS — I130 Hypertensive heart and chronic kidney disease with heart failure and stage 1 through stage 4 chronic kidney disease, or unspecified chronic kidney disease: Secondary | ICD-10-CM | POA: Diagnosis not present

## 2022-12-31 DIAGNOSIS — N1832 Chronic kidney disease, stage 3b: Secondary | ICD-10-CM | POA: Diagnosis not present

## 2022-12-31 DIAGNOSIS — I5041 Acute combined systolic (congestive) and diastolic (congestive) heart failure: Secondary | ICD-10-CM | POA: Diagnosis not present

## 2022-12-31 DIAGNOSIS — I10 Essential (primary) hypertension: Secondary | ICD-10-CM | POA: Diagnosis not present

## 2022-12-31 DIAGNOSIS — Z299 Encounter for prophylactic measures, unspecified: Secondary | ICD-10-CM | POA: Diagnosis not present

## 2022-12-31 DIAGNOSIS — N183 Chronic kidney disease, stage 3 unspecified: Secondary | ICD-10-CM | POA: Diagnosis not present

## 2022-12-31 DIAGNOSIS — R609 Edema, unspecified: Secondary | ICD-10-CM | POA: Diagnosis not present

## 2023-01-22 DIAGNOSIS — I10 Essential (primary) hypertension: Secondary | ICD-10-CM | POA: Diagnosis not present

## 2023-01-22 DIAGNOSIS — J449 Chronic obstructive pulmonary disease, unspecified: Secondary | ICD-10-CM | POA: Diagnosis not present

## 2023-01-22 DIAGNOSIS — Z299 Encounter for prophylactic measures, unspecified: Secondary | ICD-10-CM | POA: Diagnosis not present

## 2023-03-25 DIAGNOSIS — I10 Essential (primary) hypertension: Secondary | ICD-10-CM | POA: Diagnosis not present

## 2023-03-25 DIAGNOSIS — I25118 Atherosclerotic heart disease of native coronary artery with other forms of angina pectoris: Secondary | ICD-10-CM | POA: Diagnosis not present

## 2023-03-25 DIAGNOSIS — E782 Mixed hyperlipidemia: Secondary | ICD-10-CM | POA: Diagnosis not present

## 2023-03-26 DIAGNOSIS — I10 Essential (primary) hypertension: Secondary | ICD-10-CM | POA: Diagnosis not present

## 2023-03-26 DIAGNOSIS — Z7189 Other specified counseling: Secondary | ICD-10-CM | POA: Diagnosis not present

## 2023-03-26 DIAGNOSIS — N183 Chronic kidney disease, stage 3 unspecified: Secondary | ICD-10-CM | POA: Diagnosis not present

## 2023-03-26 DIAGNOSIS — Z23 Encounter for immunization: Secondary | ICD-10-CM | POA: Diagnosis not present

## 2023-03-26 DIAGNOSIS — Z0000 Encounter for general adult medical examination without abnormal findings: Secondary | ICD-10-CM | POA: Diagnosis not present

## 2023-03-26 DIAGNOSIS — I25118 Atherosclerotic heart disease of native coronary artery with other forms of angina pectoris: Secondary | ICD-10-CM | POA: Diagnosis not present

## 2023-03-26 DIAGNOSIS — J449 Chronic obstructive pulmonary disease, unspecified: Secondary | ICD-10-CM | POA: Diagnosis not present

## 2023-03-26 DIAGNOSIS — N1832 Chronic kidney disease, stage 3b: Secondary | ICD-10-CM | POA: Diagnosis not present

## 2023-03-26 DIAGNOSIS — Z299 Encounter for prophylactic measures, unspecified: Secondary | ICD-10-CM | POA: Diagnosis not present

## 2023-04-29 ENCOUNTER — Other Ambulatory Visit: Payer: Self-pay | Admitting: Internal Medicine

## 2023-04-29 DIAGNOSIS — Z1231 Encounter for screening mammogram for malignant neoplasm of breast: Secondary | ICD-10-CM

## 2023-05-03 ENCOUNTER — Ambulatory Visit
Admission: RE | Admit: 2023-05-03 | Discharge: 2023-05-03 | Disposition: A | Discharge status: Home / Self Care | Payer: Medicare Other | Source: Ambulatory Visit | Attending: Internal Medicine | Admitting: Internal Medicine

## 2023-05-03 DIAGNOSIS — Z1231 Encounter for screening mammogram for malignant neoplasm of breast: Secondary | ICD-10-CM

## 2023-08-04 DIAGNOSIS — D692 Other nonthrombocytopenic purpura: Secondary | ICD-10-CM | POA: Diagnosis not present

## 2023-08-04 DIAGNOSIS — Z299 Encounter for prophylactic measures, unspecified: Secondary | ICD-10-CM | POA: Diagnosis not present

## 2023-08-04 DIAGNOSIS — I10 Essential (primary) hypertension: Secondary | ICD-10-CM | POA: Diagnosis not present

## 2023-08-04 DIAGNOSIS — N1832 Chronic kidney disease, stage 3b: Secondary | ICD-10-CM | POA: Diagnosis not present

## 2023-08-04 DIAGNOSIS — M25552 Pain in left hip: Secondary | ICD-10-CM | POA: Diagnosis not present

## 2023-08-13 DIAGNOSIS — K5900 Constipation, unspecified: Secondary | ICD-10-CM | POA: Diagnosis not present

## 2023-08-13 DIAGNOSIS — Z299 Encounter for prophylactic measures, unspecified: Secondary | ICD-10-CM | POA: Diagnosis not present

## 2023-09-02 DIAGNOSIS — R0600 Dyspnea, unspecified: Secondary | ICD-10-CM | POA: Diagnosis not present

## 2023-09-02 DIAGNOSIS — I2510 Atherosclerotic heart disease of native coronary artery without angina pectoris: Secondary | ICD-10-CM | POA: Diagnosis not present

## 2023-09-02 DIAGNOSIS — I10 Essential (primary) hypertension: Secondary | ICD-10-CM | POA: Diagnosis not present

## 2023-09-02 DIAGNOSIS — E785 Hyperlipidemia, unspecified: Secondary | ICD-10-CM | POA: Diagnosis not present

## 2023-09-02 DIAGNOSIS — Z7722 Contact with and (suspected) exposure to environmental tobacco smoke (acute) (chronic): Secondary | ICD-10-CM | POA: Diagnosis not present

## 2023-09-02 DIAGNOSIS — Z20822 Contact with and (suspected) exposure to covid-19: Secondary | ICD-10-CM | POA: Diagnosis not present

## 2023-09-02 DIAGNOSIS — J441 Chronic obstructive pulmonary disease with (acute) exacerbation: Secondary | ICD-10-CM | POA: Diagnosis not present

## 2023-09-02 DIAGNOSIS — Z87891 Personal history of nicotine dependence: Secondary | ICD-10-CM | POA: Diagnosis not present

## 2023-09-23 DIAGNOSIS — I25118 Atherosclerotic heart disease of native coronary artery with other forms of angina pectoris: Secondary | ICD-10-CM | POA: Diagnosis not present

## 2023-09-23 DIAGNOSIS — I10 Essential (primary) hypertension: Secondary | ICD-10-CM | POA: Diagnosis not present

## 2023-09-23 DIAGNOSIS — Z299 Encounter for prophylactic measures, unspecified: Secondary | ICD-10-CM | POA: Diagnosis not present

## 2023-09-23 DIAGNOSIS — N183 Chronic kidney disease, stage 3 unspecified: Secondary | ICD-10-CM | POA: Diagnosis not present

## 2023-10-17 DIAGNOSIS — Z79899 Other long term (current) drug therapy: Secondary | ICD-10-CM | POA: Diagnosis not present

## 2023-10-17 DIAGNOSIS — Z885 Allergy status to narcotic agent status: Secondary | ICD-10-CM | POA: Diagnosis not present

## 2023-10-17 DIAGNOSIS — Z7951 Long term (current) use of inhaled steroids: Secondary | ICD-10-CM | POA: Diagnosis not present

## 2023-10-17 DIAGNOSIS — E785 Hyperlipidemia, unspecified: Secondary | ICD-10-CM | POA: Diagnosis not present

## 2023-10-17 DIAGNOSIS — J449 Chronic obstructive pulmonary disease, unspecified: Secondary | ICD-10-CM | POA: Diagnosis not present

## 2023-10-17 DIAGNOSIS — Z87891 Personal history of nicotine dependence: Secondary | ICD-10-CM | POA: Diagnosis not present

## 2023-10-17 DIAGNOSIS — I2510 Atherosclerotic heart disease of native coronary artery without angina pectoris: Secondary | ICD-10-CM | POA: Diagnosis not present

## 2023-10-17 DIAGNOSIS — R109 Unspecified abdominal pain: Secondary | ICD-10-CM | POA: Diagnosis not present

## 2023-10-17 DIAGNOSIS — R1012 Left upper quadrant pain: Secondary | ICD-10-CM | POA: Diagnosis not present

## 2023-10-17 DIAGNOSIS — I10 Essential (primary) hypertension: Secondary | ICD-10-CM | POA: Diagnosis not present

## 2023-10-17 DIAGNOSIS — N281 Cyst of kidney, acquired: Secondary | ICD-10-CM | POA: Diagnosis not present

## 2024-01-04 DIAGNOSIS — Z7189 Other specified counseling: Secondary | ICD-10-CM | POA: Diagnosis not present

## 2024-01-04 DIAGNOSIS — R5383 Other fatigue: Secondary | ICD-10-CM | POA: Diagnosis not present

## 2024-01-04 DIAGNOSIS — Z0000 Encounter for general adult medical examination without abnormal findings: Secondary | ICD-10-CM | POA: Diagnosis not present

## 2024-01-04 DIAGNOSIS — I10 Essential (primary) hypertension: Secondary | ICD-10-CM | POA: Diagnosis not present

## 2024-01-04 DIAGNOSIS — Z299 Encounter for prophylactic measures, unspecified: Secondary | ICD-10-CM | POA: Diagnosis not present

## 2024-01-04 DIAGNOSIS — E7800 Pure hypercholesterolemia, unspecified: Secondary | ICD-10-CM | POA: Diagnosis not present

## 2024-01-04 DIAGNOSIS — Z1389 Encounter for screening for other disorder: Secondary | ICD-10-CM | POA: Diagnosis not present

## 2024-01-04 DIAGNOSIS — Z79899 Other long term (current) drug therapy: Secondary | ICD-10-CM | POA: Diagnosis not present

## 2024-01-04 DIAGNOSIS — E559 Vitamin D deficiency, unspecified: Secondary | ICD-10-CM | POA: Diagnosis not present

## 2024-01-09 DIAGNOSIS — S6982XA Other specified injuries of left wrist, hand and finger(s), initial encounter: Secondary | ICD-10-CM | POA: Diagnosis not present

## 2024-01-09 DIAGNOSIS — Z7722 Contact with and (suspected) exposure to environmental tobacco smoke (acute) (chronic): Secondary | ICD-10-CM | POA: Diagnosis not present

## 2024-01-09 DIAGNOSIS — S51812A Laceration without foreign body of left forearm, initial encounter: Secondary | ICD-10-CM | POA: Diagnosis not present

## 2024-01-09 DIAGNOSIS — Z885 Allergy status to narcotic agent status: Secondary | ICD-10-CM | POA: Diagnosis not present

## 2024-01-09 DIAGNOSIS — I10 Essential (primary) hypertension: Secondary | ICD-10-CM | POA: Diagnosis not present

## 2024-01-09 DIAGNOSIS — Z79899 Other long term (current) drug therapy: Secondary | ICD-10-CM | POA: Diagnosis not present

## 2024-01-09 DIAGNOSIS — S80212A Abrasion, left knee, initial encounter: Secondary | ICD-10-CM | POA: Diagnosis not present

## 2024-01-09 DIAGNOSIS — S6981XA Other specified injuries of right wrist, hand and finger(s), initial encounter: Secondary | ICD-10-CM | POA: Diagnosis not present

## 2024-01-09 DIAGNOSIS — Z87891 Personal history of nicotine dependence: Secondary | ICD-10-CM | POA: Diagnosis not present

## 2024-01-09 DIAGNOSIS — S8992XA Unspecified injury of left lower leg, initial encounter: Secondary | ICD-10-CM | POA: Diagnosis not present

## 2024-01-09 DIAGNOSIS — W1839XA Other fall on same level, initial encounter: Secondary | ICD-10-CM | POA: Diagnosis not present

## 2024-01-09 DIAGNOSIS — K219 Gastro-esophageal reflux disease without esophagitis: Secondary | ICD-10-CM | POA: Diagnosis not present

## 2024-01-09 DIAGNOSIS — S0083XA Contusion of other part of head, initial encounter: Secondary | ICD-10-CM | POA: Diagnosis not present

## 2024-01-09 DIAGNOSIS — E785 Hyperlipidemia, unspecified: Secondary | ICD-10-CM | POA: Diagnosis not present

## 2024-01-09 DIAGNOSIS — S80219A Abrasion, unspecified knee, initial encounter: Secondary | ICD-10-CM | POA: Diagnosis not present

## 2024-01-09 DIAGNOSIS — J449 Chronic obstructive pulmonary disease, unspecified: Secondary | ICD-10-CM | POA: Diagnosis not present

## 2024-01-09 DIAGNOSIS — S098XXA Other specified injuries of head, initial encounter: Secondary | ICD-10-CM | POA: Diagnosis not present

## 2024-01-09 DIAGNOSIS — I2510 Atherosclerotic heart disease of native coronary artery without angina pectoris: Secondary | ICD-10-CM | POA: Diagnosis not present

## 2024-01-10 DIAGNOSIS — I10 Essential (primary) hypertension: Secondary | ICD-10-CM | POA: Diagnosis not present

## 2024-01-10 DIAGNOSIS — Z299 Encounter for prophylactic measures, unspecified: Secondary | ICD-10-CM | POA: Diagnosis not present

## 2024-01-10 DIAGNOSIS — N183 Chronic kidney disease, stage 3 unspecified: Secondary | ICD-10-CM | POA: Diagnosis not present

## 2024-01-10 DIAGNOSIS — Z9181 History of falling: Secondary | ICD-10-CM | POA: Diagnosis not present

## 2024-01-10 DIAGNOSIS — R52 Pain, unspecified: Secondary | ICD-10-CM | POA: Diagnosis not present

## 2024-01-10 DIAGNOSIS — R21 Rash and other nonspecific skin eruption: Secondary | ICD-10-CM | POA: Diagnosis not present

## 2024-01-17 DIAGNOSIS — L821 Other seborrheic keratosis: Secondary | ICD-10-CM | POA: Diagnosis not present

## 2024-01-17 DIAGNOSIS — L578 Other skin changes due to chronic exposure to nonionizing radiation: Secondary | ICD-10-CM | POA: Diagnosis not present

## 2024-01-17 DIAGNOSIS — D1801 Hemangioma of skin and subcutaneous tissue: Secondary | ICD-10-CM | POA: Diagnosis not present

## 2024-01-17 DIAGNOSIS — D692 Other nonthrombocytopenic purpura: Secondary | ICD-10-CM | POA: Diagnosis not present

## 2024-03-27 DIAGNOSIS — Z0000 Encounter for general adult medical examination without abnormal findings: Secondary | ICD-10-CM | POA: Diagnosis not present

## 2024-03-27 DIAGNOSIS — I10 Essential (primary) hypertension: Secondary | ICD-10-CM | POA: Diagnosis not present

## 2024-03-27 DIAGNOSIS — N183 Chronic kidney disease, stage 3 unspecified: Secondary | ICD-10-CM | POA: Diagnosis not present

## 2024-03-27 DIAGNOSIS — Z299 Encounter for prophylactic measures, unspecified: Secondary | ICD-10-CM | POA: Diagnosis not present

## 2024-03-27 DIAGNOSIS — I2510 Atherosclerotic heart disease of native coronary artery without angina pectoris: Secondary | ICD-10-CM | POA: Diagnosis not present

## 2024-03-27 DIAGNOSIS — Z23 Encounter for immunization: Secondary | ICD-10-CM | POA: Diagnosis not present

## 2024-03-27 DIAGNOSIS — J449 Chronic obstructive pulmonary disease, unspecified: Secondary | ICD-10-CM | POA: Diagnosis not present

## 2024-04-26 DIAGNOSIS — I10 Essential (primary) hypertension: Secondary | ICD-10-CM | POA: Diagnosis not present

## 2024-04-26 DIAGNOSIS — N183 Chronic kidney disease, stage 3 unspecified: Secondary | ICD-10-CM | POA: Diagnosis not present

## 2024-04-26 DIAGNOSIS — Z299 Encounter for prophylactic measures, unspecified: Secondary | ICD-10-CM | POA: Diagnosis not present

## 2024-04-26 DIAGNOSIS — J449 Chronic obstructive pulmonary disease, unspecified: Secondary | ICD-10-CM | POA: Diagnosis not present

## 2024-04-26 DIAGNOSIS — G459 Transient cerebral ischemic attack, unspecified: Secondary | ICD-10-CM | POA: Diagnosis not present

## 2024-04-27 DIAGNOSIS — H40033 Anatomical narrow angle, bilateral: Secondary | ICD-10-CM | POA: Diagnosis not present

## 2024-04-27 DIAGNOSIS — H2513 Age-related nuclear cataract, bilateral: Secondary | ICD-10-CM | POA: Diagnosis not present
